# Patient Record
Sex: Male | Born: 1985 | Race: Black or African American | Hispanic: No | Marital: Married | Smoking: Former smoker
Health system: Southern US, Community
[De-identification: ages and names within clinical notes are randomized; demographics above are authoritative.]

## PROBLEM LIST (undated history)

## (undated) DIAGNOSIS — L600 Ingrowing nail: Secondary | ICD-10-CM

## (undated) DIAGNOSIS — M109 Gout, unspecified: Secondary | ICD-10-CM

---

## 2009-08-24 ENCOUNTER — Emergency Department (HOSPITAL_COMMUNITY): Admission: EM | Admit: 2009-08-24 | Discharge: 2009-08-24 | Payer: Self-pay | Admitting: Emergency Medicine

## 2010-02-13 ENCOUNTER — Emergency Department (HOSPITAL_COMMUNITY): Admission: EM | Admit: 2010-02-13 | Discharge: 2010-02-13 | Payer: Self-pay | Admitting: Emergency Medicine

## 2010-04-15 ENCOUNTER — Emergency Department (HOSPITAL_COMMUNITY): Admission: EM | Admit: 2010-04-15 | Discharge: 2010-04-16 | Payer: Self-pay | Admitting: Emergency Medicine

## 2010-09-16 ENCOUNTER — Emergency Department (HOSPITAL_COMMUNITY)
Admission: EM | Admit: 2010-09-16 | Discharge: 2010-09-17 | Payer: Self-pay | Source: Home / Self Care | Admitting: Emergency Medicine

## 2010-11-26 LAB — URINALYSIS, ROUTINE W REFLEX MICROSCOPIC
Ketones, ur: NEGATIVE mg/dL
Nitrite: NEGATIVE
Specific Gravity, Urine: 1.021 (ref 1.005–1.030)
Urobilinogen, UA: 0.2 mg/dL (ref 0.0–1.0)

## 2010-11-26 LAB — URINE MICROSCOPIC-ADD ON

## 2010-12-10 ENCOUNTER — Emergency Department (HOSPITAL_COMMUNITY)
Admission: EM | Admit: 2010-12-10 | Discharge: 2010-12-11 | Disposition: A | Discharge status: Home / Self Care | Payer: Self-pay | Attending: Emergency Medicine | Admitting: Emergency Medicine

## 2010-12-10 DIAGNOSIS — K089 Disorder of teeth and supporting structures, unspecified: Secondary | ICD-10-CM | POA: Insufficient documentation

## 2010-12-10 DIAGNOSIS — K047 Periapical abscess without sinus: Secondary | ICD-10-CM | POA: Insufficient documentation

## 2011-01-11 ENCOUNTER — Emergency Department (HOSPITAL_COMMUNITY)
Admission: EM | Admit: 2011-01-11 | Discharge: 2011-01-11 | Disposition: A | Discharge status: Home / Self Care | Payer: Self-pay | Attending: Emergency Medicine | Admitting: Emergency Medicine

## 2011-01-11 ENCOUNTER — Emergency Department (HOSPITAL_COMMUNITY): Payer: Self-pay

## 2011-01-11 DIAGNOSIS — M79609 Pain in unspecified limb: Secondary | ICD-10-CM | POA: Insufficient documentation

## 2011-06-09 ENCOUNTER — Emergency Department (HOSPITAL_COMMUNITY)
Admission: EM | Admit: 2011-06-09 | Discharge: 2011-06-09 | Disposition: A | Discharge status: Home / Self Care | Payer: Self-pay | Attending: Emergency Medicine | Admitting: Emergency Medicine

## 2011-06-09 DIAGNOSIS — K089 Disorder of teeth and supporting structures, unspecified: Secondary | ICD-10-CM | POA: Insufficient documentation

## 2011-06-09 DIAGNOSIS — K047 Periapical abscess without sinus: Secondary | ICD-10-CM | POA: Insufficient documentation

## 2011-10-13 ENCOUNTER — Emergency Department (HOSPITAL_COMMUNITY)
Admission: EM | Admit: 2011-10-13 | Discharge: 2011-10-13 | Disposition: A | Discharge status: Home / Self Care | Payer: Self-pay | Attending: Emergency Medicine | Admitting: Emergency Medicine

## 2011-10-13 ENCOUNTER — Encounter (HOSPITAL_COMMUNITY): Payer: Self-pay | Admitting: Emergency Medicine

## 2011-10-13 DIAGNOSIS — K029 Dental caries, unspecified: Secondary | ICD-10-CM | POA: Insufficient documentation

## 2011-10-13 DIAGNOSIS — K0889 Other specified disorders of teeth and supporting structures: Secondary | ICD-10-CM

## 2011-10-13 DIAGNOSIS — K089 Disorder of teeth and supporting structures, unspecified: Secondary | ICD-10-CM | POA: Insufficient documentation

## 2011-10-13 MED ORDER — HYDROCODONE-ACETAMINOPHEN 5-325 MG PO TABS: 1.0000 | ORAL_TABLET | ORAL | Status: AC | PRN

## 2011-10-13 MED ORDER — PENICILLIN V POTASSIUM 500 MG PO TABS: 500.0000 mg | ORAL_TABLET | Freq: Four times a day (QID) | ORAL | Status: AC

## 2011-10-13 NOTE — ED Provider Notes (Signed)
Medical screening examination/treatment/procedure(s) were performed by non-physician practitioner and as supervising physician I was immediately available for consultation/collaboration. Devoria Albe, MD, Armando Gang   Ward Givens, MD 10/13/11 [phone]

## 2011-10-13 NOTE — ED Provider Notes (Signed)
History     CSN: [id number]  Arrival date & time 10/13/11  1331   First MD Initiated Contact with Patient 10/13/11 1338      Chief Complaint  Patient presents with  . Dental Pain    pain in teeth in lower l/jaw, x 4 days    (Consider location/radiation/quality/duration/timing/severity/associated sxs/prior treatment) Patient is a 26 y.o. male presenting with tooth pain. The history is provided by the patient.  Dental PainThe primary symptoms include mouth pain. Primary symptoms do not include fever. The symptoms began 3 to 5 days ago. The symptoms are worsening. The symptoms are recurrent. The symptoms occur constantly.  Additional symptoms do not include: facial swelling, trouble swallowing and ear pain. Medical issues include: smoking.    History reviewed. No pertinent past medical history.  History reviewed. No pertinent past surgical history.  Family History  Problem Relation Age of Onset  . Diabetes Mother   . Hypertension Mother     History  Substance Use Topics  . Smoking status: Current Some Day Smoker  . Smokeless tobacco: Not on file  . Alcohol Use: Yes      Review of Systems  Constitutional: Negative for fever and chills.  HENT: Negative for ear pain, facial swelling and trouble swallowing.        Toothache.  Gastrointestinal: Negative.   Musculoskeletal: Negative for myalgias.  Neurological: Negative.     Allergies  Review of patient's allergies indicates no known allergies.  Home Medications   Current Outpatient Rx  Name Route Sig Dispense Refill  . VITAMIN C 1000 MG PO TABS Oral Take 1,000 mg by mouth daily.    . IBUPROFEN 200 MG PO TABS Oral Take 200 mg by mouth every 6 (six) hours as needed.    Marland Kitchen ONE-DAILY MULTI VITAMINS PO TABS Oral Take 2 tablets by mouth daily.      BP 126/87  Pulse 71  Temp(Src) 98.7 F (37.1 C) (Oral)  SpO2 100%  Physical Exam  Constitutional: He is oriented to person, place, and time. He appears well-developed  and well-nourished.  HENT:       Generally good dentition with left lower rear molar having severe decay. No visible abscess.   Neck: Normal range of motion.  Pulmonary/Chest: Effort normal.  Musculoskeletal: Normal range of motion.  Neurological: He is alert and oriented to person, place, and time.  Skin: Skin is warm and dry.  Psychiatric: He has a normal mood and affect.    ED Course  Procedures (including critical care time)  Labs Reviewed - No data to display No results found.   No diagnosis found.    MDM          Rodena Medin, PA-C 10/13/11 1453

## 2012-12-24 ENCOUNTER — Encounter (HOSPITAL_COMMUNITY): Payer: Self-pay | Admitting: Emergency Medicine

## 2012-12-24 ENCOUNTER — Emergency Department (HOSPITAL_COMMUNITY): Payer: BC Managed Care – PPO

## 2012-12-24 ENCOUNTER — Emergency Department (HOSPITAL_COMMUNITY)
Admission: EM | Admit: 2012-12-24 | Discharge: 2012-12-24 | Disposition: A | Discharge status: Home / Self Care | Payer: BC Managed Care – PPO | Attending: Emergency Medicine | Admitting: Emergency Medicine

## 2012-12-24 DIAGNOSIS — M25473 Effusion, unspecified ankle: Secondary | ICD-10-CM | POA: Insufficient documentation

## 2012-12-24 DIAGNOSIS — M25476 Effusion, unspecified foot: Secondary | ICD-10-CM | POA: Insufficient documentation

## 2012-12-24 DIAGNOSIS — F17200 Nicotine dependence, unspecified, uncomplicated: Secondary | ICD-10-CM | POA: Insufficient documentation

## 2012-12-24 DIAGNOSIS — M109 Gout, unspecified: Secondary | ICD-10-CM

## 2012-12-24 DIAGNOSIS — Z872 Personal history of diseases of the skin and subcutaneous tissue: Secondary | ICD-10-CM | POA: Insufficient documentation

## 2012-12-24 HISTORY — DX: Ingrowing nail: L60.0

## 2012-12-24 MED ORDER — OXYCODONE-ACETAMINOPHEN 5-325 MG PO TABS: 1.0000 | ORAL_TABLET | ORAL | Status: DC | PRN

## 2012-12-24 MED ORDER — COLCHICINE 0.6 MG PO TABS: 0.6000 mg | ORAL_TABLET | Freq: Two times a day (BID) | ORAL | Status: DC

## 2012-12-24 NOTE — ED Provider Notes (Signed)
History     CSN: [id number]  Arrival date & time 12/24/12  1247   First MD Initiated Contact with Patient 12/24/12 1401      Chief Complaint  Patient presents with  . Foot Pain    right    (Consider location/radiation/quality/duration/timing/severity/associated sxs/prior treatment) HPI Comments: Pt presents to the ED for right great toe pain and swelling x 2 days without recent trauma or injury.  States he wore dress shoes and walked around all weekend so thought he had just rubbed his toe the wrong way.  Earlier this am he was unable to get his shoe on due to pain and swelling.  No personal hx of gout but states father has it all the time.  Drinks beer and eats red meat on occasion.  No numbness or paresthesias of RLE.  Normal ROM and sensation of right great toe and foot.   The history is provided by the patient.    Past Medical History  Diagnosis Date  . Ingrown toenail     History reviewed. No pertinent past surgical history.  Family History  Problem Relation Age of Onset  . Diabetes Mother   . Hypertension Mother     History  Substance Use Topics  . Smoking status: Current Some Day Smoker  . Smokeless tobacco: Not on file  . Alcohol Use: Yes      Review of Systems  Musculoskeletal: Positive for joint swelling and arthralgias.  All other systems reviewed and are negative.    Allergies  Review of patient's allergies indicates no known allergies.  Home Medications   Current Outpatient Rx  Name  Route  Sig  Dispense  Refill  . ibuprofen (ADVIL,MOTRIN) 200 MG tablet   Oral   Take 200 mg by mouth every 6 (six) hours as needed for pain.            BP 135/94  Pulse 52  Temp(Src) 99 F (37.2 C) (Oral)  Resp 16  SpO2 100%  Physical Exam  Nursing note and vitals reviewed. Constitutional: He is oriented to person, place, and time. He appears well-developed and well-nourished.  HENT:  Head: Normocephalic and atraumatic.  Mouth/Throat: Oropharynx is  clear and moist.  Eyes: Conjunctivae and EOM are normal. Pupils are equal, round, and reactive to light.  Neck: Normal range of motion.  Cardiovascular: Normal rate, regular rhythm and normal heart sounds.   Pulmonary/Chest: Effort normal and breath sounds normal.  Musculoskeletal: Normal range of motion.       Right ankle: He exhibits swelling. He exhibits normal range of motion, no ecchymosis, no deformity, no laceration and normal pulse.       Feet:  Right great toe swelling and erythema, no laceration, deformity or signs of infection; normal sensation, strong distal pulse and cap refill  Neurological: He is alert and oriented to person, place, and time.  Skin: Skin is warm and dry.  Psychiatric: He has a normal mood and affect.    ED Course  Procedures (including critical care time)  Labs Reviewed - No data to display Dg Foot Complete Right  12/24/2012  *RADIOLOGY REPORT*  Clinical Data: Proximal great toe pain, question gout  RIGHT FOOT COMPLETE - 3+ VIEW  Comparison: Right foot films of 01/11/2011  Findings: Tarsal - metatarsal alignment is normal.  Joint spaces appear normal.  No erosion is seen.  No soft tissue calcification is noted.  IMPRESSION: Negative.   Original Report Authenticated By: Dwyane Dee, M.D.  1. Gout of big toe       MDM   Pt presenting to the ED for R great toe pain and swelling x 2 days.  No laceration, evidence of cellulitis or infection- symptoms consistent with gout.  Rx colchicine and percocet.  Begin taking ant-inflammatories at home.  Gout handout given.  Discussed plan with pt-he agreed.  Return precautions advised.    Garlon Hatchet, PA-C 12/24/12 1534

## 2012-12-24 NOTE — ED Notes (Signed)
Pt states was wearing dress shoes on Friday, R foot started hurting, then yesterday and today was worse and R inside of foot started swelling, swelling noted to R inner foot, tender to touch, no warmness noted.

## 2012-12-24 NOTE — ED Notes (Signed)
Patient c/o right foot pain and swelling. X 2 days.

## 2012-12-25 NOTE — ED Provider Notes (Signed)
Medical screening examination/treatment/procedure(s) were performed by non-physician practitioner and as supervising physician I was immediately available for consultation/collaboration.   Benny Lennert, MD 12/25/12 [phone]

## 2013-09-26 ENCOUNTER — Emergency Department (HOSPITAL_COMMUNITY): Payer: BC Managed Care – PPO

## 2013-09-26 ENCOUNTER — Encounter (HOSPITAL_COMMUNITY): Payer: Self-pay | Admitting: Emergency Medicine

## 2013-09-26 ENCOUNTER — Emergency Department (HOSPITAL_COMMUNITY)
Admission: EM | Admit: 2013-09-26 | Discharge: 2013-09-26 | Disposition: A | Discharge status: Home / Self Care | Payer: BC Managed Care – PPO | Attending: Emergency Medicine | Admitting: Emergency Medicine

## 2013-09-26 DIAGNOSIS — Z791 Long term (current) use of non-steroidal anti-inflammatories (NSAID): Secondary | ICD-10-CM | POA: Insufficient documentation

## 2013-09-26 DIAGNOSIS — M25519 Pain in unspecified shoulder: Secondary | ICD-10-CM | POA: Insufficient documentation

## 2013-09-26 DIAGNOSIS — S29011A Strain of muscle and tendon of front wall of thorax, initial encounter: Secondary | ICD-10-CM

## 2013-09-26 DIAGNOSIS — F17200 Nicotine dependence, unspecified, uncomplicated: Secondary | ICD-10-CM | POA: Insufficient documentation

## 2013-09-26 DIAGNOSIS — M436 Torticollis: Secondary | ICD-10-CM | POA: Insufficient documentation

## 2013-09-26 DIAGNOSIS — Z872 Personal history of diseases of the skin and subcutaneous tissue: Secondary | ICD-10-CM | POA: Insufficient documentation

## 2013-09-26 DIAGNOSIS — R071 Chest pain on breathing: Secondary | ICD-10-CM | POA: Insufficient documentation

## 2013-09-26 DIAGNOSIS — R0789 Other chest pain: Secondary | ICD-10-CM

## 2013-09-26 DIAGNOSIS — R5381 Other malaise: Secondary | ICD-10-CM | POA: Insufficient documentation

## 2013-09-26 DIAGNOSIS — R5383 Other fatigue: Secondary | ICD-10-CM

## 2013-09-26 MED ORDER — NAPROXEN 500 MG PO TABS: 500.0000 mg | ORAL_TABLET | Freq: Two times a day (BID) | ORAL | Status: DC

## 2013-09-26 MED ORDER — HYDROCODONE-ACETAMINOPHEN 5-325 MG PO TABS: 1.0000 | ORAL_TABLET | Freq: Four times a day (QID) | ORAL | Status: DC | PRN

## 2013-09-26 NOTE — ED Provider Notes (Signed)
CSN: [id number]     Arrival date & time 09/26/13  1120 History  This chart was scribed for non-physician practitioner Raymon MuttonMarissa Cedric Denison, PA-C, working with Shon Batonourtney F Horton, MD, by Yevette EdwardsAngela Bracken, ED Scribe. This patient was seen in room WTR8/WTR8 and the patient's care was started at 12:18 PM.  First MD Initiated Contact with Patient 09/26/13 1133     Chief Complaint  Patient presents with  . chest wall pain     The history is provided by the patient. No language interpreter was used.   HPI Comments: Illene Boluseddy Atkins is a 28 y.o. male who presents to the Emergency Department complaining of two days of pain to his left chest wall which began after a session of high repetition weight-lifting involving 90-100 pounds. The pt reports the pain radiates from his left chest to his left shoulder, and that his left arm is weak due to the pain. He also reports stiffness to his neck. The pt states raising his arm and palpation increase the pain to his chest and shoulder, and he characterizes the pain as "pulling."  The pt states he has experienced swelling to the left chest wall in addition to the pain. He has used IBU to treat the pain without resolution. He has not used Icy-hot or similar ointment. He denies fever, chills, neck pain, abdominal pain, nausea, emesis, diarrhea, SOB, dyspnea, and numbness or paresthesia of his arm. The pt is a current smoker.   He does not have a PCP.   Past Medical History  Diagnosis Date  . Ingrown toenail    History reviewed. No pertinent past surgical history. Family History  Problem Relation Age of Onset  . Diabetes Mother   . Hypertension Mother    History  Substance Use Topics  . Smoking status: Current Some Day Smoker  . Smokeless tobacco: Not on file  . Alcohol Use: Yes    Review of Systems  Constitutional: Negative for fever.  Respiratory: Negative for shortness of breath.   Cardiovascular: Positive for chest pain (Pt characterizes the chest pain as  muscular in nature. ).  Gastrointestinal: Negative for nausea, vomiting, abdominal pain and diarrhea.  Musculoskeletal: Positive for arthralgias, myalgias and neck stiffness. Negative for neck pain.  Neurological: Positive for weakness. Negative for numbness.  All other systems reviewed and are negative.   Allergies  Review of patient's allergies indicates no known allergies.  Home Medications   Current Outpatient Rx  Name  Route  Sig  Dispense  Refill  . ibuprofen (ADVIL,MOTRIN) 200 MG tablet   Oral   Take 200 mg by mouth every 6 (six) hours as needed for pain.          . naproxen (NAPROSYN) 500 MG tablet   Oral   Take 1 tablet (500 mg total) by mouth 2 (two) times daily.   30 tablet   0    Triage Vitals: BP 146/85  Pulse 63  Temp(Src) 98.8 F (37.1 C) (Oral)  Resp 20  SpO2 98%  Physical Exam  Nursing note and vitals reviewed. Constitutional: He is oriented to person, place, and time. He appears well-developed and well-nourished. No distress.  HENT:  Head: Normocephalic and atraumatic.  Eyes: EOM are normal.  Neck: Normal range of motion. Neck supple. No tracheal deviation present.  Negative neck stiffness Negative nuchal rigidity Negative cervical lymphadenopathy  Cardiovascular: Normal rate, regular rhythm and normal heart sounds.   Pulses:      Radial pulses are 2+ on the right  side, and 2+ on the left side.  Pulmonary/Chest: Effort normal and breath sounds normal. No respiratory distress. He has no wheezes. He has no rales. He exhibits tenderness.    Left pectoralis major muscle identified to be swollen when compared to the right side. Discomfort upon palpation to the pectoralis major muscle. Negative erythema, inflammation, lesions, sores, ecchymosis noted.  Musculoskeletal: Normal range of motion. He exhibits tenderness.       Arms: Mild swelling localized to the left shoulder. Discomfort upon palpation to the anterior and posterior aspect of the left  shoulder. Positive apprehension test identified. Negative drop arm. Negative tenting of the clavicles bilaterally. Discomfort upon palpation to the left biceps and triceps. Full range of motion to the left shoulder identified with discomfort-discomfort noted particularly with inversion and eversion.  Lymphadenopathy:    He has no cervical adenopathy.  Neurological: He is alert and oriented to person, place, and time.  Cranial nerves III-XII grossly intact Strength 5+/5+ to upper and lower extremities bilaterally with resistance applied, equal distribution noted Strength intact to MCP, PIP, DIP joints of left hand Sensation intact to upper extremities bilaterally with differentiation to sharp and dull touch   Skin: Skin is warm and dry.  Psychiatric: He has a normal mood and affect. His behavior is normal.    ED Course  Procedures (including critical care time)  DIAGNOSTIC STUDIES: Oxygen Saturation is 98% on room air, normal by my interpretation.    COORDINATION OF CARE:  12:25 PM- Discussed treatment plan with patient, which includes imaging, and the patient agreed to the plan.   12:37 PM This provider spoke with Dr. Cristy Folks who recommended chest xray to be performed and for patient to be placed in sling. Reported that this is not an emergent MRI scenario. Recommended patient to be followed up with orthopedics.    Dg Chest 2 View  09/26/2013   CLINICAL DATA:  Chest pain  EXAM: CHEST  2 VIEW  COMPARISON:  April 16, 2010  FINDINGS: Lungs are clear. Heart size and pulmonary vascularity are normal. No adenopathy. No pneumothorax. No bone lesions.  IMPRESSION: No abnormality noted.   Electronically Signed   By: Bretta Bang M.D.   On: 09/26/2013 13:19   Dg Shoulder Left  09/26/2013   CLINICAL DATA:  Left shoulder pain.  EXAM: LEFT SHOULDER - 2+ VIEW  COMPARISON:  None.  FINDINGS: There is no evidence of fracture or dislocation. Visualized ribs appear normal. There is no evidence of  arthropathy or other focal bone abnormality. Soft tissues are unremarkable.  IMPRESSION: Normal left shoulder.   Electronically Signed   By: Roque Lias M.D.   On: 09/26/2013 13:19   Labs Review Labs Reviewed - No data to display Imaging Review Dg Chest 2 View  09/26/2013   CLINICAL DATA:  Chest pain  EXAM: CHEST  2 VIEW  COMPARISON:  April 16, 2010  FINDINGS: Lungs are clear. Heart size and pulmonary vascularity are normal. No adenopathy. No pneumothorax. No bone lesions.  IMPRESSION: No abnormality noted.   Electronically Signed   By: Bretta Bang M.D.   On: 09/26/2013 13:19   Dg Shoulder Left  09/26/2013   CLINICAL DATA:  Left shoulder pain.  EXAM: LEFT SHOULDER - 2+ VIEW  COMPARISON:  None.  FINDINGS: There is no evidence of fracture or dislocation. Visualized ribs appear normal. There is no evidence of arthropathy or other focal bone abnormality. Soft tissues are unremarkable.  IMPRESSION: Normal left shoulder.  Electronically Signed   By: Roque Lias M.D.   On: 09/26/2013 13:19    EKG Interpretation   None       MDM   1. Chest wall pain   2. Rupture of pectoralis major muscle     Filed Vitals:   09/26/13 1127  BP: 146/85  Pulse: 63  Temp: 98.8 F (37.1 C)  TempSrc: Oral  Resp: 20  SpO2: 98%   I personally performed the services described in this documentation, which was scribed in my presence. The recorded information has been reviewed and is accurate.  Patient presenting to emergency department with left-sided chest discomfort-muscular-and left shoulder pain does been ongoing for the past 2 days. Patient reports that he was lifting weights, approximately 4 sets of 20 reps. Patient reported that he was working out his triceps and biceps on Monday and has worked at her shoulders on Tuesday. Patient reports he's noticed swelling to the left side of his chest approximately 2 days ago. Reports he's been using ibuprofen as needed with minimal relief. Alert oriented. GCS  15. Heart rate and rhythm normal. Lungs clear to auscultation. Swelling identified to the pectoris major muscle of the left side and left shoulder. Discomfort upon palpation to the left pectoralis major muscle and anterior, posterior aspect of the left shoulder. Full range of motion to left shoulder identified with most discomfort upon inversion and eversion of the left shoulder. Discomfort upon palpation to triceps and biceps the left arm circumferentially. Negative deformities identified-negative sunken in appearance identified left shoulder. Negative tenting of the clavicles bilaterally. Pulses palpable and strong, radial 2+ bilaterally. Sensation intact. Strength intact with equal resistance identified. Strength intact to MCP, PIP, DIP joints of the left hand. Discussed case, presentation and physical exam with attending physician, Dr. Wilkie Aye recommended chest x-ray to be performed. Attending physician reported that this is not an emergent MRI. Left shoulder x-ray negative findings. Chest x-ray negative findings. Doubt fracture. Suspicion to be possible muscular injury - suspicion to be pectoralis major rupture. Patient placed in sling for comfort. Patient stable, afebrile. Discharge patient. Discussed with patient to avoid any physical or strenuous activity-recommended patient not to perform any lifting. Discharge patient with anti-inflammatories and small dose of pain medications as discussed course, cautions, disposal technique. Discussed with patient to rest and stay hydrated. Discussed with patient absolutely no heavy weight lifting/lifting in general. Referred patient to orthopedics. Discussed with patient to closely monitor symptoms and if symptoms are to worsen or change to report back to the ED - strict return instructions given.  Patient agreed to plan of care, understood, all questions answered.    Raymon Mutton, PA-C 09/27/13 [phone]

## 2013-09-26 NOTE — Discharge Instructions (Signed)
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 It is responsible for bringing the arm across the body and for rotating the arm inward. A pectoralis major rupture is a tear in the tendon that attaches the chest muscle to the upper arm bone (humerus). When the tendon is torn, there is a loss of connection between the muscle and the bone. Pectoralis major ruptures usually involve the tendon pulling off from the arm bone, although sometimes the muscle may tear in the mid-belly or at the point where the muscle becomes tendon. SYMPTOMS   A "pop" or tearing, and severe sharp, often burning, pain in the chest at the time of injury.  Tenderness, swelling, warmth, or redness and later bruising (contusion) over and around the pectoralis muscle-tendon, chest, and armpit region.  Pain and weakness when trying to use the chest muscle.  Loss of shape of the armpit region, especially when pushing your hands together in front of your body.  Loss of firm fullness, when pushing on the area where the tendon ruptured (a defect between the ends of the tendon and bone where they separated from each other). CAUSES  The pectoralis major muscle or tendon tears when a force is placed on it that is greater than it can handle. Common causes of injury include:  Sudden episode of stressful over-activity.  Direct hit (trauma) to the chest.  Fall from a height. RISK INCREASES WITH:  Sports that require excessive muscle stress, (weightlifting).  Contact sports with minimal protective devices for the chest.  Wrestling.  Poor strength and flexibility.  Previous injury to the chest muscle.  Untreated pectoralis major tendinitis.  Corticosteroid injection into the pectoralis major tendon. (Corticosteroid injections damage tendons.)  Oral  anabolic steroid use. PREVENTION  Warm up and stretch properly before activity.  Allow for adequate recovery between workouts.  Maintain physical fitness:  Strength, flexibility, and endurance.  Cardiovascular fitness. PROGNOSIS  If treated properly, pectoralis major ruptures are usually curable, with a return to sports in 6 to 9 months.  RELATED COMPLICATIONS   Weakness of the pectoralis major, especially if left untreated.  Re-rupture of the tendon after treatment.  Prolonged disability.  Risks of surgery: infection, injury to nerves (numbness, weakness, or paralysis), bleeding, hematoma (blood clot), pseudocyst (collection of fluid), shoulder stiffness, shoulder weakness, and pain with strenuous activity.  Loss of chest or armpit shape.  Inability to repair rupture. TREATMENT Treatment first involves resting from any activities that aggravate the symptoms. The use of ice and medicine will help reduce pain and inflammation. The use of strengthening and stretching exercises may help reduce pain with activity. These exercises may be performed at home. However, referral to a therapist may be advised for further evaluation and treatment, such as ultrasound therapy. If the rupture occurs in the muscle or the muscle-tendon juncture, surgery repair is not possible. However, for tears that occur at the attachment site of the arm bone, surgery may be advised. Without surgery, the loss of normal armpit shape and weakness of the shoulder will persist. For the best chance of a successful outcome, surgery must be performed within the first few weeks after injury. After surgery, the chest and shoulder of the affected side are restrained, to allow for healing. After restraint, it is important to perform strengthening and stretching exercises to help regain strength  and a full range of motion.  MEDICATION   If pain medicine is needed, nonsteroidal anti-inflammatory medicines (aspirin and ibuprofen),  or other minor pain relievers (acetaminophen), are often advised.  Do not take pain medicine for 7 days before surgery.  Prescription pain relievers may be given, if your caregiver thinks they are needed. Use only as directed and only as much as you need. COLD THERAPY  Cold treatment (icing) should be applied for 10 to 15 minutes every 2 to 3 hours for inflammation and pain, and immediately after activity that aggravates your symptoms. Use ice packs or an ice massage. SEEK MEDICAL CARE IF:  Pain increases, despite treatment.  Any of the following occur after surgery: signs of infection, including fever, increased pain, swelling, redness, drainage of fluids, or bleeding in the affected area.  New, unexplained symptoms develop. (Drugs used in treatment may produce side effects.) EXERCISES RANGE OF MOTION (ROM) AND STRETCHING EXERCISES - Pectoralis Major Rupture These exercises may help you when beginning to rehabilitate your injury. Your symptoms may resolve with or without further involvement from your physician, physical therapist or athletic trainer. While completing these exercises, remember:   Restoring tissue flexibility helps normal motion to return to the joints. This allows healthier, less painful movement and activity.  An effective stretch should be held for at least 30 seconds.  A stretch should never be painful. You should only feel a gentle lengthening or release in the stretched tissue. ROM - Pendulum  Bend at the waist so that your right / left arm falls away from your body. Support yourself with your opposite hand on a solid surface, such as a table or a countertop.  Your right / left arm should be perpendicular to the ground. If it is not perpendicular, you need to lean over farther. Relax the muscles in your right / left arm and shoulder as much as possible.  Gently sway your hips and trunk so they move your right / left arm without any use of your right / left shoulder  muscles.  Progress your movements so that your right / left arm moves side to side, then forward and backward, and finally, both clockwise and counterclockwise.  Complete __________ repetitions in each direction. Many people use this exercise to relieve discomfort in their shoulder, as well as to gain range of motion. Repeat __________ times. Complete this exercise __________ times per day. STRETCH  Flexion, Standing  Stand with good posture. With an underhand grip on your right / left and an overhand grip on the opposite hand, grasp a broomstick or cane so that your hands are a little more than shoulder width apart.  Keeping your right / left elbow straight and shoulder muscles relaxed, push the stick with your opposite hand to raise your right / left arm in front of your body and then overhead. Raise your arm until you feel a stretch in your right / left shoulder, but before you have increased shoulder pain.  Try to avoid shrugging your right / left shoulder as your arm rises, by keeping your shoulder blade tucked down and toward your mid-back spine. Hold __________ seconds.  Slowly return to the starting position. Repeat __________ times. Complete this exercise __________ times per day.  STRETCH  Abduction, Supine  Lie on your back. With an underhand grip on your right / left hand and an overhand grip on the opposite hand, grasp a broomstick or cane so that your hands are a little more than shoulder  width apart.  Keeping your right / left elbow straight and shoulder muscles relaxed, push the stick with your opposite hand to raise your right / left arm out to the side of your body and then overhead. Raise your arm until you feel a stretch in your right / left shoulder, but before you have increased shoulder pain.  Try to avoid shrugging your right / left shoulder as your arm rises, by keeping your shoulder blade tucked down and toward your mid-back spine. Hold __________ seconds.  Slowly  return to the starting position. Repeat __________ times. Complete this exercise __________ times per day.  ROM  Flexion, Active-Assisted  Lie on your back. You may bend your knees for comfort.  Grasp a broomstick or cane so your hands are about shoulder width apart. Your right / left hand should grip the end of the stick, so that your hand is positioned "thumbs-up," as if you were about to shake hands.  Using your healthy arm to lead, raise your right / left arm overhead until you feel a gentle stretch in your shoulder. Hold __________ seconds.  Use the stick to assist in returning your right / left arm to its starting position. Repeat __________ times. Complete this exercise __________ times per day.  STRETCH - External Rotation and Abductio  Stagger your stance through a doorframe. It does not matter which foot is forward.  Choose one of the following positions as instructed by your physician, physical therapist or athletic trainer. Place your hands:  and forearms above your head and on the door frame.  and forearms at head height and on the door frame.  at elbow height and on the door frame.  Keeping your head and chest upright and your stomach muscles tight to prevent over-extending your low-back, slowly shift your weight onto your front foot until you feel a stretch across your chest and in the front of your shoulders.  Hold __________ seconds. Shift your weight to your back foot to release the stretch. Repeat __________ times. Complete this stretch __________ times per day.  STRENGTHENING EXERCISES - Pectoralis Major Rupture  These exercises may help you when beginning to rehabilitate your injury. They may resolve your symptoms with or without further involvement from your physician, physical therapist or athletic trainer. While completing these exercises, remember:   Muscles can gain both the endurance and the strength needed for everyday activities through controlled  exercises.  Complete these exercises as instructed by your physician, physical therapist or athletic trainer. Increase the resistance and repetitions only as guided by your caregiver.  You may experience muscle soreness or fatigue, but the pain or discomfort you are trying to eliminate should never worsen during these exercises. If this pain does worsen, stop and make certain you are following the directions exactly. If the pain is still present after adjustments, discontinue the exercise until you can discuss the trouble with your caregiver. STRENGTH - Scapular Protractors, Standing  Stand arms length away from a wall. Place your hands on the wall, keeping your elbows straight.  Begin by dropping your shoulder blades down and toward your mid-back spine.  To strengthen your protractors, keep your shoulder blades down, but slide them forward on your rib cage. It will feel as if you are lifting the back of your rib cage away from the wall. This is a subtle motion and can be challenging to complete. Ask your caregiver for further instruction, if you are not sure you are doing the exercise correctly.  Hold for __________ seconds. Slowly return to the starting position, resting the muscles completely before starting the next repetition. Repeat __________ times. Complete this exercise __________ times per day. STRENGTH - Horizontal Abductors Choose one of the two positions to complete this exercise. Prone: lying on stomach:  Lie on your stomach on a firm surface so that your right / left arm overhangs the edge. Rest your forehead on your opposite forearm. With your palm facing the floor and your elbow straight, hold a __________ weight in your hand.  Squeeze your right / left shoulder blade to your mid-back spine and then slowly raise your arm to the height of the bed.  Hold for __________ seconds. Slowly reverse the directions and return to the starting position, controlling the weight as you lower  your arm. Repeat __________ times. Complete this exercise __________ times per day. Standing:   Secure a rubber exercise band or tubing to a fixed object (table, pole) so that it is at the height of your shoulders when you are either standing, or sitting on a firm armless chair.  Grasp an end of the band in each hand and have your palms face each other. Straighten your elbows and lift your hands straight in front of you at shoulder height. Step back away from the secured end of band until it becomes tense.  Squeeze your shoulder blades together. Keeping your elbows locked and your hands at shoulder height, bring your hands out to your sides.  Hold __________ seconds. Slowly ease the tension on the band, as you reverse the directions and return to the starting position. Repeat __________ times. Complete this exercise __________ times per day. STRENGTH - Scapular Protractors, Quadruped  Get onto your hands and knees with your shoulders directly over your hands (or as close as you can comfortably be).  Keeping your elbows locked, lift the back of your rib cage up into your shoulder blades so your mid-back rounds out. Keep your neck muscles relaxed.  Hold this position for __________ seconds. Slowly return to the starting position and allow your muscles to relax completely before starting the next repetition. Repeat __________ times. Complete this exercise __________ times per day.  STRENGTH  Scapular Depressors  Find a sturdy chair without wheels, such as a dining table chair, and place your hands on the armrests.  Keeping your feet on the floor, lift your bottom from the seat and lock your elbows.  Keeping your elbows straight, allow gravity to pull your body weight down. Your shoulders will rise toward your ears.  Raise your body against gravity by drawing your shoulder blades down your back, shortening the distance between your shoulders and ears. Although your feet should always maintain  contact with the floor, your feet should progressively support less body weight as you get stronger.  Hold __________ seconds. In a controlled and slow manner, lower your body weight to begin the next repetition. Repeat __________ times. Complete this exercise __________ times per day.  STRENGTH - Scapular Protractors, Supine  Lie on your back on a firm surface. Extend your right / left arm straight into the air while holding a __________ weight in your hand.  Keeping your head and back in place, lift your shoulder off the floor.  Hold __________ seconds. Slowly return to the starting position and allow your muscles to relax completely before starting the next repetition. Repeat __________ times. Complete this exercise __________ times per day. Document Released: 08/29/2005 Document Revised: 11/21/2011 Document Reviewed: 12/11/2008 ExitCare  Patient Information 2014 ExitCare, [place]. medications as prescribed Please no physical activity or lifting until seen by orthopedics. Please keep arm in sling at all times Please massage with icy hot ointment Please continue monitor symptoms closely and if symptoms are to worsen or change (fever, swelling, redness, bruising, worsening pain, numbness, tingling, fall, injury, chest pain, shortness of breath, difficulty breathing) please report back to emergency department immediately  Chest Wall Pain Chest wall pain is pain in or around the bones and muscles of your chest. It may take up to 6 weeks to get better. It may take longer if you must stay physically active in your work and activities.  CAUSES  Chest wall pain may happen on its own. However, it may be caused by:  A viral illness like the flu.  Injury.  Coughing.  Exercise.  Arthritis.  Fibromyalgia.  Shingles. HOME CARE INSTRUCTIONS   Avoid overtiring physical activity. Try not to strain or perform activities that cause pain. This includes any activities using your chest or your  abdominal and side muscles, especially if heavy weights are used.  Put ice on the sore area.  Put ice in a plastic bag.  Place a towel between your skin and the bag.  Leave the ice on for 15-20 minutes per hour while awake for the first 2 days.  Only take over-the-counter or prescription medicines for pain, discomfort, or fever as directed by your caregiver. SEEK IMMEDIATE MEDICAL CARE IF:   Your pain increases, or you are very uncomfortable.  You have a fever.  Your chest pain becomes worse.  You have new, unexplained symptoms.  You have nausea or vomiting.  You feel sweaty or lightheaded.  You have a cough with phlegm (sputum), or you cough up blood. MAKE SURE YOU:   Understand these instructions.  Will watch your condition.  Will get help right away if you are not doing well or get worse. Document Released: 08/29/2005 Document Revised: 11/21/2011 Document Reviewed: 04/25/2011 [facility] Patient Information 2014 [place], [place].   Emergency Department Resource Guide 1) Find a Doctor and Pay Out of Pocket Although you won't have to find out who is covered by your insurance plan, it is a good idea to ask around and get recommendations. You will then need to call the office and see if the doctor you have chosen will accept you as a new patient and what types of options they offer for patients who are self-pay. Some doctors offer discounts or will set up payment plans for their patients who do not have insurance, but you will need to ask so you aren't surprised when you get to your appointment.  2) Contact Your Local Health Department Not all health departments have doctors that can see patients for sick visits, but many do, so it is worth a call to see if yours does. If you don't know where your local health department is, you can check in your phone book. The CDC also has a tool to help you locate your state's health department, and many state websites also have listings of  all of their local health departments.  3) Find a Walk-in Clinic If your illness is not likely to be very severe or complicated, you may want to try a walk in clinic. These are popping up all over the country in pharmacies, drugstores, and shopping centers. They're usually staffed by nurse practitioners or physician assistants that have been trained to treat common illnesses and complaints. They're usually fairly quick and  inexpensive. However, if you have serious medical issues or chronic medical problems, these are probably not your best option.  No Primary Care Doctor: - Call Health Connect at  [phone] - they can help you locate a primary care doctor that  accepts your insurance, provides certain services, etc. - Physician Referral Service- [phone]  Chronic Pain Problems: Organization         Address  Phone   Notes  [facility]  [phone] Patients need to be referred by their primary care doctor.   Medication Assistance: Organization         Address  Phone   Notes  [facility] [address] [place] [place], [place] [address] [phone] --Must be a resident of [facility] -- Must have NO insurance coverage whatsoever (no Medicaid/ Medicare, etc.) -- The pt. MUST have a primary care doctor that directs their care regularly and follows them in the community   [facility]  [phone]   [employer]  [phone]    Agencies that provide inexpensive medical care: Organization         Address  Phone   Notes  [facility]  [phone]   [facility]    [phone]   [facility] [address] [place], [place] [address] [phone]   [facility] [address], [place] [phone]   [facility]anned Parenthood    [phone]   [facility]    [phone]   [facility]  [address],  [place] Phone:  [phone], Fax:  [phone] Hours of Operation:  9 am - 6 pm, M-F.  Also accepts Medicaid/Medicare and self-pay.  [facility]  [address], [place] Phone: [phone], Fax: [phone]. Hours of Operation:  8:30 am - 5:30 pm, M-F.  Also accepts Medicaid and self-pay.  [facility] [place] [address] [place] Phone: [phone]   [facility] [address] Natasha Bence [place], [place] [phone], Ext. [phone] Mondays & Thursdays: 7-9 AM.  First 15 patients are seen on a first come, first serve basis.    Medicaid-accepting [facility] Providers:  Organization         Address  Phone   Notes  [facility] [address],  [phone] Also accepts self-pay patients.  [facility] [address] Laurell Josephs [place], [place]  [phone]   [facility] [address], [place] [phone]   [facility] [address], [place] [phone]   Renaye Rakers [address], [place]   [phone] Only accepts [place] patients after they have their name applied to their card.   Self-Pay (no insurance) in [facility]:  Organization         Address  Phone   Notes  Sickle Cell Patients, [facility] [address] [place], [place] [phone]   [facility] [address] [place], [place] [phone]   [facility] [place]  [address], [place] [phone]   [facility]/Dr. Osei-Bonsu  [address] [place] or [address], [place] [phone] Phone number for both [place] and [place] locations is the same.  [facility] and [facility] [address] [place] [phone]   [facility] [address], [place] or [address] [phone]   [facility] [address], [place] [phone], phone; [phone], fax Sees patients 1st and 3rd Saturday of every month.  Must not qualify for public or private insurance (i.e. Medicaid, Medicare, [place] Health Choice, Veterans' Benefits)  Household income should be no more than 200% of the poverty level The clinic cannot treat you if you are pregnant or think you are pregnant  Sexually transmitted diseases are not treated at the clinic.    Dental Care: Organization         Address  Phone  Notes  [facility] [place] [facility] Department of [facility] [address] [place], [place] [phone] Accepts children up to age 63 who are enrolled in [place] or [place] Health Choice; pregnant women with a Medicaid card; and children who have applied for Medicaid or [place] Health Choice, but were declined, whose parents can pay a reduced fee at time of service.  [facility] Department of [facility]  [address], [place] [phone] Accepts children up to age 66 who are enrolled in [place] or [place] Health Choice; pregnant women with a Medicaid card; and children who have applied for Medicaid or [place] Health Choice, but were declined, whose parents can pay a reduced fee at time of service.  [facility]  [address] [place], [place] [phone] Patients are seen by appointment only. Walk-ins are not accepted. [facility] will see patients 65 years of age and older. Monday - Tuesday (8am-5pm) Most Wednesdays (8:30-5pm) $30 per visit, cash only  [facility]  [address], [facility] [phone] Patients are seen by appointment only. Walk-ins are not accepted. [facility] will see patients 78 years of age and older. One Wednesday Evening (Monthly: Volunteer Based).  $30 per visit, cash only  [employer]   [phone] for adults; Children under age 18, call Graduate Pediatric Dentistry at [phone]. Children aged 74-14, please call [phone] to request a pediatric application.  Dental services are provided in all areas of dental care including fillings, crowns and bridges, complete and partial dentures, implants, gum treatment, root canals, and extractions. Preventive care is also provided. Treatment is provided to both adults and children. Patients are selected via a lottery and there is often a waiting list.   [facility] [address] [place]  [phone] [email or web address]   [facility] [address] [place], [place] [phone], Ext. [phone] Second and Fourth Thursday of each month, opens at 6:30 AM; Clinic ends at 9 AM.  Patients are seen on a first-come first-served basis, and a limited number are seen during each clinic.   [facility]  [address] [place], [place] [phone]   Eligibility Requirements You must have lived in [place], [place], or [place] counties for at least the last three months.   You cannot be eligible for state or federal sponsored [place], including [employer], [place], or [employer].   You generally cannot be eligible for healthcare insurance through your employer.    How to apply: Eligibility screenings are held every Tuesday and Wednesday afternoon from 1:00 pm until 4:00 pm. You  do not need an appointment for the interview!  [facility] [address], [place], [place] [phone]   [facility]  [phone]   [facility]  [phone]   [facility]  [phone]    Behavioral Health Resources in the Community: Intensive Outpatient Programs Organization         Address  Phone  Notes  [facility] [address], [place], [place] [phone]   [facility] [address], [place], [place] [phone]   [facility] [address] [place], [place]  [phone]   [facility] [address],  [place], [place] [phone] or [phone]   Substance Abuse Resources Organization         Address  Phone  Notes  [facility]  [phone]   [facility]  [phone]   [facility]  [phone]   Floydene Flock  [phone]   Residential & Outpatient Substance Abuse Program  [phone]   Psychological Services Organization         Address  Phone  Notes  [facility]  [phone]   [facility]  [phone]   [facility] Mental Health [address], [place] [phone] or [phone]    Mobile Crisis Teams Organization         Address  Phone  Notes  [facility], Mobile Crisis Care Unit  [phone]   [facility]  [address] [place], [place] [phone]   Doristine Locks [address] [place] [phone]    Self-Help/Support Groups Organization         Address  Phone             Notes  [facility] - variety of support groups  [phone]- I7437963 Call for more information  Narcotics Anonymous (NA), [facility] [address], [employer] [place]  2 meetings at this location   Statistician         Address  Phone  Notes  [facility] [address],    [place]  [phone]   [facility]  [address] [place] [address], [place], [place] [phone]   [facility] [address] [place], [place] [phone] Admissions: 8am-3pm M-F  [facility] [address]    [place], [place] [phone]   [facility] [address] [place], [place], [place] [phone]   [facility] [address],  [place], [place] [phone]     [facility] - Intensive Outpatient [address], [place], [place] [phone]   [facility] ([facility] [address] [place]  [place], [place] [phone] or [phone]   [facility] ([facility]) [address] [place], [place] [phone] Accepts Medicaid  [facility] [address],  [place] [phone] Substance Abuse/Addiction Treatment   [facility] Organization         Address  Phone  Notes  [facility]  [phone]   Angie Fava, PhD [address] [place] [place], [place]   [phone] or [phone]   [facility]   [address] [place], [place] [phone]   [facility] [address] [place], [place] [phone] Insurance/Medicaid/sponsorship through [employer] and Families [address]  [place], [place] [phone] [place], [place] [phone]    Dr. Adele Schilder  [phone]   [facility] Dept. 1) [address] [place] 2) [place] 3)  [address], [place] [phone]  [phone]   [place] [phone] or [phone] (After Hours)

## 2013-09-26 NOTE — ED Notes (Signed)
Pt working out a few days and pulled a muscle in his upper left chest; states has some swelling with pain with movement and touch

## 2013-09-28 NOTE — ED Provider Notes (Signed)
Medical screening examination/treatment/procedure(s) were performed by non-physician practitioner and as supervising physician I was immediately available for consultation/collaboration.  EKG Interpretation   None        Shon Batonourtney F Horton, MD 09/28/13 [phone]

## 2013-11-20 ENCOUNTER — Encounter (HOSPITAL_COMMUNITY): Payer: Self-pay | Admitting: Emergency Medicine

## 2013-11-20 ENCOUNTER — Emergency Department (HOSPITAL_COMMUNITY)
Admission: EM | Admit: 2013-11-20 | Discharge: 2013-11-20 | Disposition: A | Discharge status: Home / Self Care | Payer: BC Managed Care – PPO | Source: Home / Self Care | Attending: Family Medicine | Admitting: Family Medicine

## 2013-11-20 DIAGNOSIS — J039 Acute tonsillitis, unspecified: Secondary | ICD-10-CM

## 2013-11-20 LAB — POCT RAPID STREP A: Streptococcus, Group A Screen (Direct): NEGATIVE

## 2013-11-20 MED ORDER — CLINDAMYCIN HCL 300 MG PO CAPS: 300.0000 mg | ORAL_CAPSULE | Freq: Four times a day (QID) | ORAL | Status: DC

## 2013-11-20 NOTE — Discharge Instructions (Signed)
Your strep test was negative, however, you are being treated for atypical infection that can cause tonsillitis. In addition to taking medication as directed, use warm salt water gargles 4 x day and tylenol or ibuprofen as directed on packing for discomfort. If you develop any difficulty breathing, speaking or swallowing, seek immediate medical attention.

## 2013-11-20 NOTE — ED Provider Notes (Signed)
CSN: [id number]     Arrival date & time 11/20/13  1758 History   First MD Initiated Contact with Patient 11/20/13 1903     Chief Complaint  Patient presents with  . Sore Throat   (Consider location/radiation/quality/duration/timing/severity/associated sxs/prior Treatment) HPI Comments: Reports associated chills. No additional URI sx. No difficulty breathing, speaking or handling his own secretions.   Patient is a 28 y.o. male presenting with pharyngitis. The history is provided by the patient.  Sore Throat This is a new problem. Episode onset: 4 days ago. The problem occurs constantly. The problem has been gradually worsening. The symptoms are aggravated by swallowing, eating and drinking.    Past Medical History  Diagnosis Date  . Ingrown toenail    History reviewed. No pertinent past surgical history. Family History  Problem Relation Age of Onset  . Cancer Father     Bone cancer  . Diabetes Other   . Hypertension Other    History  Substance Use Topics  . Smoking status: Former Smoker    Types: Cigars    Quit date: 07/23/2013  . Smokeless tobacco: Not on file  . Alcohol Use: Yes     Comment: occasional    Review of Systems  All other systems reviewed and are negative.    Allergies  Review of patient's allergies indicates no known allergies.  Home Medications   Current Outpatient Rx  Name  Route  Sig  Dispense  Refill  . ibuprofen (ADVIL,MOTRIN) 200 MG tablet   Oral   Take 800 mg by mouth every 6 (six) hours as needed for moderate pain.          . clindamycin (CLEOCIN) 300 MG capsule   Oral   Take 1 capsule (300 mg total) by mouth 4 (four) times daily. X 10 days   40 capsule   0   . HYDROcodone-acetaminophen (NORCO/VICODIN) 5-325 MG per tablet   Oral   Take 1 tablet by mouth every 6 (six) hours as needed.   5 tablet   0   . naproxen (NAPROSYN) 500 MG tablet   Oral   Take 1 tablet (500 mg total) by mouth 2 (two) times daily.   30 tablet   0     BP 128/82  Pulse 58  Temp(Src) 98.7 F (37.1 C) (Oral)  Resp 18  SpO2 100% Physical Exam  Nursing note and vitals reviewed. Constitutional: He is oriented to person, place, and time. He appears well-developed and well-nourished. No distress.  HENT:  Head: Normocephalic and atraumatic.  Right Ear: Hearing, tympanic membrane, external ear and ear canal normal.  Left Ear: Hearing, tympanic membrane, external ear and ear canal normal.  Nose: Nose normal.  Mouth/Throat: Uvula is midline and mucous membranes are normal. No trismus in the jaw. No dental abscesses or dental caries. Oropharyngeal exudate, posterior oropharyngeal edema and posterior oropharyngeal erythema present. No tonsillar abscesses.  Eyes: Conjunctivae are normal. Right eye exhibits no discharge. Left eye exhibits no discharge. No scleral icterus.  Neck: Normal range of motion. Neck supple. No thyromegaly present.  Cardiovascular: Normal rate, regular rhythm and normal heart sounds.   Pulmonary/Chest: Effort normal and breath sounds normal. No stridor.  Musculoskeletal: Normal range of motion.  Lymphadenopathy:    He has no cervical adenopathy.  Neurological: He is alert and oriented to person, place, and time.  Skin: Skin is warm and dry.  Psychiatric: He has a normal mood and affect. His behavior is normal.    ED Course  Procedures (including critical care time) Labs Review Labs Reviewed  POCT RAPID STREP A ([facility] URG CARE ONLY)   Imaging Review No results found.   MDM   1. Tonsillitis with exudate    Tonsillitis: Rapid strep negative. Will cover for atypical infection with clindamycin while results of throat culture are pending. Will advise warm salt water gargles TID-QID, tylenol or ibuprofen for pain. Cautioned patient that if symptoms do not begin to improve over next 48 hours or he develops any difficulty breathing, speaking or swallowing, he should seek immediate medical attention.    Jess Barters  [place], [place] 11/20/13 1945

## 2013-11-20 NOTE — ED Notes (Signed)
C/o tonsils swelling onset Sunday night and sore throat on Mon.  States he woke up with sweating and cold chills.  States his lymph nodes are swollen and neck is a little stiff.

## 2013-11-20 NOTE — ED Provider Notes (Signed)
Medical screening examination/treatment/procedure(s) were performed by a resident physician or non-physician practitioner and as the supervising physician I was immediately available for consultation/collaboration.  Evan Corey, MD    Evan S Corey, MD 11/20/13 2113 

## 2013-11-22 LAB — CULTURE, GROUP A STREP

## 2015-05-20 ENCOUNTER — Ambulatory Visit (INDEPENDENT_AMBULATORY_CARE_PROVIDER_SITE_OTHER): Payer: BLUE CROSS/BLUE SHIELD | Admitting: Urgent Care

## 2015-05-20 VITALS — BP 122/70 | HR 75 | Temp 98.7°F | Resp 18 | Ht 74.0 in | Wt 341.0 lb

## 2015-05-20 DIAGNOSIS — Z113 Encounter for screening for infections with a predominantly sexual mode of transmission: Secondary | ICD-10-CM | POA: Diagnosis not present

## 2015-05-20 DIAGNOSIS — E669 Obesity, unspecified: Secondary | ICD-10-CM

## 2015-05-20 DIAGNOSIS — Z0000 Encounter for general adult medical examination without abnormal findings: Secondary | ICD-10-CM | POA: Diagnosis not present

## 2015-05-20 LAB — CBC
HCT: 41.4 % (ref 39.0–52.0)
HEMOGLOBIN: 13 g/dL (ref 13.0–17.0)
MCH: 21.9 pg — ABNORMAL LOW (ref 26.0–34.0)
MCHC: 31.4 g/dL (ref 30.0–36.0)
MCV: 69.8 fL — ABNORMAL LOW (ref 78.0–100.0)
MPV: 10.3 fL (ref 8.6–12.4)
Platelets: 351 10*3/uL (ref 150–400)
RBC: 5.93 MIL/uL — AB (ref 4.22–5.81)
RDW: 16.3 % — ABNORMAL HIGH (ref 11.5–15.5)
WBC: 9.6 10*3/uL (ref 4.0–10.5)

## 2015-05-20 LAB — COMPREHENSIVE METABOLIC PANEL
ALBUMIN: 4.3 g/dL (ref 3.6–5.1)
ALT: 24 U/L (ref 9–46)
AST: 31 U/L (ref 10–40)
Alkaline Phosphatase: 106 U/L (ref 40–115)
BILIRUBIN TOTAL: 0.4 mg/dL (ref 0.2–1.2)
BUN: 13 mg/dL (ref 7–25)
CO2: 28 mmol/L (ref 20–31)
CREATININE: 0.98 mg/dL (ref 0.60–1.35)
Calcium: 9.5 mg/dL (ref 8.6–10.3)
Chloride: 102 mmol/L (ref 98–110)
Glucose, Bld: 89 mg/dL (ref 65–99)
Potassium: 4.3 mmol/L (ref 3.5–5.3)
SODIUM: 140 mmol/L (ref 135–146)
TOTAL PROTEIN: 7.5 g/dL (ref 6.1–8.1)

## 2015-05-20 LAB — LIPID PANEL
CHOLESTEROL: 193 mg/dL (ref 125–200)
HDL: 42 mg/dL (ref >=40)
LDL CALC: 119 mg/dL (ref <130)
TRIGLYCERIDES: 159 mg/dL — AB (ref <150)
Total CHOL/HDL Ratio: 4.6 Ratio (ref <=5.0)
VLDL: 32 mg/dL — ABNORMAL HIGH (ref <30)

## 2015-05-20 LAB — TSH: TSH: 1.758 u[IU]/mL (ref 0.350–4.500)

## 2015-05-20 NOTE — Patient Instructions (Signed)

## 2015-05-20 NOTE — Progress Notes (Signed)
MRN: [id number]  Subjective:   Mr. Victor Atkins is a 29 y.o. male new to our practice presenting for annual physical exam.  Medical care team includes: PCP: No primary care provider on file. Specialists: None.   Marguis does not have any active problems on his problem list.  Patient is presenting for an annual physical. He is currently living on his own, has 1 girlfriend. He is sexually active with his girlfriend, does not use protection. Would like to have STI testing today. Patient exercises 4-5 times per week, admits mixed healthy-unhealthy diet. He has one alcoholic drink per week. He quit smoking about 2 months ago, used to smoke plaque and mild occasionally. He does report intermittent achy low back pain, resolved with 200 mg of ibuprofen one to 2 times per week.  Ah has a current medication list which includes the following prescription(s): ibuprofen. He has No Known Allergies.  Neely  has a past medical history of Ingrown toenail. Also  has no past surgical history on file.  His family history includes Cancer in his father; Diabetes in his other; Hypertension in his other. His father had sarcoma and is currently in remission.  Review of Systems  Constitutional: Negative for fever, chills, weight loss, malaise/fatigue and diaphoresis.  HENT: Negative for congestion, ear discharge, ear pain, hearing loss, nosebleeds, sore throat and tinnitus.   Eyes: Negative for blurred vision, double vision, photophobia, pain, discharge and redness.  Respiratory: Negative for cough, shortness of breath and wheezing.   Cardiovascular: Negative for chest pain, palpitations and leg swelling.  Gastrointestinal: Negative for nausea, vomiting, abdominal pain, diarrhea, constipation and blood in stool.  Genitourinary: Negative for dysuria, urgency, frequency, hematuria and flank pain.  Musculoskeletal: Negative for myalgias, back pain and joint pain.  Skin: Negative for itching and rash.    Neurological: Negative for dizziness, tingling, seizures, loss of consciousness, weakness and headaches.  Endo/Heme/Allergies: Negative for polydipsia.  Psychiatric/Behavioral: Negative for depression, suicidal ideas, hallucinations, memory loss and substance abuse. The patient is not nervous/anxious and does not have insomnia.      Objective:   Vitals: BP 122/70 mmHg  Pulse 75  Temp(Src) 98.7 F (37.1 C) (Oral)  Resp 18  Ht 6\' 2"  (1.88 m)  Wt 341 lb (154.677 kg)  BMI 43.76 kg/m2  SpO2 99%   Visual Acuity Screening   Right eye Left eye Both eyes  Without correction:     With correction: 20/15 20/15 20/15    Physical Exam  Constitutional: He is oriented to person, place, and time. He appears well-developed and well-nourished.  Body habitus is obese.  HENT:  TM's intact bilaterally, no effusions or erythema. Nares patent, nasal turbinates pink and moist, nasal passages patent. No sinus tenderness. Oropharynx clear, mucous membranes moist, dentition in good repair. Slightly enlarged tonsils.  Eyes: Conjunctivae and EOM are normal. Pupils are equal, round, and reactive to light. Right eye exhibits no discharge. Left eye exhibits no discharge. No scleral icterus.  Neck: Normal range of motion. Neck supple. No thyromegaly present.  Cardiovascular: Normal rate, regular rhythm and intact distal pulses.  Exam reveals no gallop and no friction rub.   No murmur heard. Pulmonary/Chest: No stridor. No respiratory distress. He has no wheezes. He has no rales.  Abdominal: Soft. Bowel sounds are normal. He exhibits no distension and no mass. There is no tenderness.  Musculoskeletal: Normal range of motion. He exhibits no edema or tenderness.  Lymphadenopathy:    He has no cervical adenopathy.  Neurological: He is alert and oriented to person, place, and time.  Skin: Skin is warm and dry. No rash noted. No erythema. No pallor.  Psychiatric: He has a normal mood and affect.   Assessment and  Plan :   1. Annual physical exam - Discussed healthy lifestyle, diet, exercise, preventative care, vaccinations, and addressed patient's concerns.  - CBC - Comprehensive metabolic panel - Lipid panel - TSH  2. Obesity - Advised dietary modification, continue exercise.  3. Screen for sexually transmitted diseases - Labs pending, I will have to call patient to come back for urine sample testing for gonorrhea, chlamydia. - HIV antibody - RPR  Wallis Bamberg, PA-C [facility] [phone] 05/20/2015  3:30 PM

## 2015-05-21 ENCOUNTER — Telehealth: Payer: Self-pay | Admitting: Urgent Care

## 2015-05-21 LAB — HIV ANTIBODY (ROUTINE TESTING W REFLEX): HIV: NONREACTIVE

## 2015-05-21 LAB — RPR

## 2015-05-21 NOTE — Telephone Encounter (Signed)
Triglycerides and VLDL slightly elevated, CBC slightly abnormal. Advised patient continue with dietary modifications for his lipid panel. Recheck CBC in 1 month or at the latest 6 months. Reminded patient that he is to return to clinic for her urine sample and gonorrhea Chlamydia testing.

## 2015-05-22 ENCOUNTER — Telehealth: Payer: Self-pay | Admitting: Family Medicine

## 2015-05-22 NOTE — Telephone Encounter (Signed)
lmom to call lab back about lab results

## 2015-08-18 ENCOUNTER — Emergency Department (HOSPITAL_COMMUNITY): Payer: Self-pay

## 2015-08-18 ENCOUNTER — Encounter (HOSPITAL_COMMUNITY): Payer: Self-pay

## 2015-08-18 ENCOUNTER — Emergency Department (HOSPITAL_COMMUNITY)
Admission: EM | Admit: 2015-08-18 | Discharge: 2015-08-18 | Disposition: A | Discharge status: Home / Self Care | Payer: Self-pay | Attending: Emergency Medicine | Admitting: Emergency Medicine

## 2015-08-18 DIAGNOSIS — M109 Gout, unspecified: Secondary | ICD-10-CM

## 2015-08-18 DIAGNOSIS — Z872 Personal history of diseases of the skin and subcutaneous tissue: Secondary | ICD-10-CM | POA: Insufficient documentation

## 2015-08-18 DIAGNOSIS — M1A0710 Idiopathic chronic gout, right ankle and foot, without tophus (tophi): Secondary | ICD-10-CM | POA: Insufficient documentation

## 2015-08-18 DIAGNOSIS — Z87891 Personal history of nicotine dependence: Secondary | ICD-10-CM | POA: Insufficient documentation

## 2015-08-18 MED ORDER — COLCHICINE 0.6 MG PO TABS: 0.6000 mg | ORAL_TABLET | Freq: Two times a day (BID) | ORAL | Status: DC

## 2015-08-18 NOTE — ED Notes (Signed)
Pt transported to radiology with tech

## 2015-08-18 NOTE — Discharge Instructions (Signed)
Gout °Gout is an inflammatory arthritis caused by a buildup of uric acid crystals in the joints. Uric acid is a chemical that is normally present in the blood. When the level of uric acid in the blood is too high it can form crystals that deposit in your joints and tissues. This causes joint redness, soreness, and swelling (inflammation). Repeat attacks are common. Over time, uric acid crystals can form into masses (tophi) near a joint, destroying bone and causing disfigurement. Gout is treatable and often preventable. °CAUSES  °The disease begins with elevated levels of uric acid in the blood. Uric acid is produced by your body when it breaks down a naturally found substance called purines. Certain foods you eat, such as meats and fish, contain high amounts of purines. Causes of an elevated uric acid level include: °· Being passed down from parent to child (heredity). °· Diseases that cause increased uric acid production (such as obesity, psoriasis, and certain cancers). °· Excessive alcohol use. °· Diet, especially diets rich in meat and seafood. °· Medicines, including certain cancer-fighting medicines (chemotherapy), water pills (diuretics), and aspirin. °· Chronic kidney disease. The kidneys are no longer able to remove uric acid well. °· Problems with metabolism. °Conditions strongly associated with gout include: °· Obesity. °· High blood pressure. °· High cholesterol. °· Diabetes. °Not everyone with elevated uric acid levels gets gout. It is not understood why some people get gout and others do not. Surgery, joint injury, and eating too much of certain foods are some of the factors that can lead to gout attacks. °SYMPTOMS  °· An attack of gout comes on quickly. It causes intense pain with redness, swelling, and warmth in a joint. °· Fever can occur. °· Often, only one joint is involved. Certain joints are more commonly involved: °· Base of the big toe. °· Knee. °· Ankle. °· Wrist. °· Finger. °Without  treatment, an attack usually goes away in a few days to weeks. Between attacks, you usually will not have symptoms, which is different from many other forms of arthritis. °DIAGNOSIS  °Your caregiver will suspect gout based on your symptoms and exam. In some cases, tests may be recommended. The tests may include: °· Blood tests. °· Urine tests. °· X-rays. °· Joint fluid exam. This exam requires a needle to remove fluid from the joint (arthrocentesis). Using a microscope, gout is confirmed when uric acid crystals are seen in the joint fluid. °TREATMENT  °There are two phases to gout treatment: treating the sudden onset (acute) attack and preventing attacks (prophylaxis). °· Treatment of an Acute Attack. °· Medicines are used. These include anti-inflammatory medicines or steroid medicines. °· An injection of steroid medicine into the affected joint is sometimes necessary. °· The painful joint is rested. Movement can worsen the arthritis. °· You may use warm or cold treatments on painful joints, depending which works best for you. °· Treatment to Prevent Attacks. °· If you suffer from frequent gout attacks, your caregiver may advise preventive medicine. These medicines are started after the acute attack subsides. These medicines either help your kidneys eliminate uric acid from your body or decrease your uric acid production. You may need to stay on these medicines for a very long time. °· The early phase of treatment with preventive medicine can be associated with an increase in acute gout attacks. For this reason, during the first few months of treatment, your caregiver may also advise you to take medicines usually used for acute gout treatment. Be sure you   understand your caregiver's directions. Your caregiver may make several adjustments to your medicine dose before these medicines are effective.  Discuss dietary treatment with your caregiver or dietitian. Alcohol and drinks high in sugar and fructose and foods  such as meat, poultry, and seafood can increase uric acid levels. Your caregiver or dietitian can advise you on drinks and foods that should be limited. HOME CARE INSTRUCTIONS   Do not take aspirin to relieve pain. This raises uric acid levels.  Only take over-the-counter or prescription medicines for pain, discomfort, or fever as directed by your caregiver.  Rest the joint as much as possible. When in bed, keep sheets and blankets off painful areas.  Keep the affected joint raised (elevated).  Apply warm or cold treatments to painful joints. Use of warm or cold treatments depends on which works best for you.  Use crutches if the painful joint is in your leg.  Drink enough fluids to keep your urine clear or pale yellow. This helps your body get rid of uric acid. Limit alcohol, sugary drinks, and fructose drinks.  Follow your dietary instructions. Pay careful attention to the amount of protein you eat. Your daily diet should emphasize fruits, vegetables, whole grains, and fat-free or low-fat milk products. Discuss the use of coffee, vitamin C, and cherries with your caregiver or dietitian. These may be helpful in lowering uric acid levels.  Maintain a healthy body weight. SEEK MEDICAL CARE IF:   You develop diarrhea, vomiting, or any side effects from medicines.  You do not feel better in 24 hours, or you are getting worse. SEEK IMMEDIATE MEDICAL CARE IF:   Your joint becomes suddenly more tender, and you have chills or a fever. MAKE SURE YOU:   Understand these instructions.  Will watch your condition.  Will get help right away if you are not doing well or get worse.   This information is not intended to replace advice given to you by your health care provider. Make sure you discuss any questions you have with your health care provider.   Document Released: 08/26/2000 Document Revised: 09/19/2014 Document Reviewed: 04/11/2012 Elsevier Interactive Patient Education 2016  [place] are compounds that affect the level of uric acid in your body. A low-purine diet is a diet that is low in purines. Eating a low-purine diet can prevent the level of uric acid in your body from getting too high and causing gout or kidney stones or both. WHAT DO I NEED TO KNOW ABOUT THIS DIET?  Choose low-purine foods. Examples of low-purine foods are listed in the next section.  Drink plenty of fluids, especially water. Fluids can help remove uric acid from your body. Try to drink 8-16 cups (1.9-3.8 L) a day.  Limit foods high in fat, especially saturated fat, as fat makes it harder for the body to get rid of uric acid. Foods high in saturated fat include pizza, cheese, ice cream, whole milk, fried foods, and gravies. Choose foods that are lower in fat and lean sources of protein. Use olive oil when cooking as it contains healthy fats that are not high in saturated fat.  Limit alcohol. Alcohol interferes with the elimination of uric acid from your body. If you are having a gout attack, avoid all alcohol.  Keep in mind that different people's bodies react differently to different foods. You will probably learn over time which foods do or do not affect you. If you discover that a food tends to  cause your gout to flare up, avoid eating that food. You can more freely enjoy foods that do not cause problems. If you have any questions about a food item, talk to your dietitian or health care provider. WHICH FOODS ARE LOW, MODERATE, AND HIGH IN PURINES? The following is a list of foods that are low, moderate, and high in purines. You can eat any amount of the foods that are low in purines. You may be able to have small amounts of foods that are moderate in purines. Ask your health care provider how much of a food moderate in purines you can have. Avoid foods high in purines. Grains  Foods low in purines: Enriched white bread, pasta, rice, cake, cornbread,  popcorn.  Foods moderate in purines: Whole-grain breads and cereals, wheat germ, bran, oatmeal. Uncooked oatmeal. Dry wheat bran or wheat germ.  Foods high in purines: Pancakes, [place] toast, biscuits, muffins. Vegetables  Foods low in purines: All vegetables, except those that are moderate in purines.  Foods moderate in purines: Asparagus, cauliflower, spinach, mushrooms, green peas. Fruits  All fruits are low in purines. Meats and other Protein Foods  Foods low in purines: Eggs, nuts, peanut butter.  Foods moderate in purines: 80-90% lean beef, lamb, veal, pork, poultry, fish, eggs, peanut butter, nuts. Crab, lobster, oysters, and shrimp. Cooked dried beans, peas, and lentils.  Foods high in purines: Anchovies, sardines, herring, mussels, tuna, codfish, scallops, trout, and haddock. Tomasa BlaseBacon. Organ meats (such as liver or kidney). Tripe. Game meat. Goose. Sweetbreads. Dairy  All dairy foods are low in purines. Low-fat and fat-free dairy products are best because they are low in saturated fat. Beverages  Drinks low in purines: Water, carbonated beverages, tea, coffee, cocoa.  Drinks moderate in purines: Soft drinks and other drinks sweetened with high-fructose corn syrup. Juices. To find whether a food or drink is sweetened with high-fructose corn syrup, look at the ingredients list.  Drinks high in purines: Alcoholic beverages (such as beer). Condiments  Foods low in purines: Salt, herbs, olives, pickles, relishes, vinegar.  Foods moderate in purines: Butter, margarine, oils, mayonnaise. Fats and Oils  Foods low in purines: All types, except gravies and sauces made with meat.  Foods high in purines: Gravies and sauces made with meat. Other Foods  Foods low in purines: Sugars, sweets, gelatin. Cake. Soups made without meat.  Foods moderate in purines: Meat-based or fish-based soups, broths, or bouillons. Foods and drinks sweetened with high-fructose corn syrup.  Foods high  in purines: High-fat desserts (such as ice cream, cookies, cakes, pies, doughnuts, and chocolate). Contact your dietitian for more information on foods that are not listed here.   This information is not intended to replace advice given to you by your health care provider. Make sure you discuss any questions you have with your health care provider.   Follow up with you rPCP as needed or if symptoms worsen. See above for diet recommendations to avoid gouty flares in the future. Return to the emergency department if you expands worsening of her symptoms, if your joint becomes severely inflamed, red or warm to the touch, or if you develop a fever.

## 2015-08-18 NOTE — ED Notes (Signed)
Pt has hx of gout in toes.  Starting having pain in rt big toe on Saturday.  Ibuprofen not helping

## 2015-08-18 NOTE — ED Provider Notes (Signed)
CSN: [id number]     Arrival date & time 08/18/15  1014 History   First MD Initiated Contact with Patient 08/18/15 1103     Chief Complaint  Patient presents with  . Gout     (Consider location/radiation/quality/duration/timing/severity/associated sxs/prior Treatment) HPI   Victor Atkins is a 29 y.o M with a pmhx of gout who presents to the Ed today c/o pain in his R great toe. Pt states that he was walking on Saturday when he felt gradual onset pain in his R great toe at the base. Pain has progressively worsened and increases with walking or movement of the toe. Pt states that he had a gouty flare 2 years ago and this feels similar to that. Pt father has long history of gout. Denies fever, trauma or injury, edema, warmth of the toe.   Past Medical History  Diagnosis Date  . Ingrown toenail    History reviewed. No pertinent past surgical history. Family History  Problem Relation Age of Onset  . Cancer Father     Bone cancer  . Diabetes Other   . Hypertension Other    Social History  Substance Use Topics  . Smoking status: Former Smoker    Types: Cigars    Quit date: 07/23/2013  . Smokeless tobacco: None  . Alcohol Use: Yes     Comment: occasional    Review of Systems  All other systems reviewed and are negative.     Allergies  Review of patient's allergies indicates no known allergies.  Home Medications   Prior to Admission medications   Medication Sig Start Date End Date Taking? Authorizing Provider  ibuprofen (ADVIL,MOTRIN) 200 MG tablet Take 800 mg by mouth every 6 (six) hours as needed for moderate pain.     Historical Provider, MD   BP 157/96 mmHg  Pulse 66  Temp(Src) 98.8 F (37.1 C) (Oral)  Resp 18  SpO2 100% Physical Exam  Constitutional: He is oriented to person, place, and time. He appears well-developed and well-nourished. No distress.  HENT:  Head: Normocephalic and atraumatic.  Eyes: Conjunctivae are normal. Right eye exhibits no discharge. Left  eye exhibits no discharge. No scleral icterus.  Cardiovascular: Normal rate.   Pulmonary/Chest: Effort normal.  Musculoskeletal:       Right ankle: Normal.       Feet:  Mild swelling and tenderness at the base of the right great toe. No erythema. No warmth. Decreased range of motion limited by pain.  Neurological: He is alert and oriented to person, place, and time. Coordination normal.  Skin: Skin is warm and dry. No rash noted. He is not diaphoretic. No erythema. No pallor.  Psychiatric: He has a normal mood and affect. His behavior is normal.  Nursing note and vitals reviewed.   ED Course  Procedures (including critical care time) Labs Review Labs Reviewed - No data to display  Imaging Review Dg Toe Great Right  08/18/2015  CLINICAL DATA:  Pain for 5 days. EXAM: RIGHT FIRST TOE COMPARISON:  Right foot December 24, 2012 FINDINGS: Frontal, oblique, and lateral views were obtained. There is no demonstrable fracture or dislocation. Joint spaces appear intact. No erosive change or periostitis. IMPRESSION: No fracture or dislocation.  No appreciable arthropathy. Electronically Signed   By: Bretta Bang III M.D.   On: 08/18/2015 11:59   I have personally reviewed and evaluated these images and lab results as part of my medical decision-making.   EKG Interpretation None  MDM   Final diagnoses:  Acute gout of right foot, unspecified cause    Pt presents with monoarticular pain and swelling. Patient with history of gouty flare.  Pt is afebrile and stable. Imaging reviewed, no evidence of occult fracture or injury. Pt without known peptic ulcer disease and not receiving concurrent treatment on warfarin. Pt dc with colchicine, which he has taken in the past for gouty flares and had improvement with. Patient states he does not want to take indomethacin as he fears it may negatively affect his kidneys. Patient may take 800 mg ibuprofen as needed for pain. Discussed that pt should  respond to treatment with in 24 hour of begining treatment & likely resolve in 2-3 days. Encourage patient to establish primary care to be any be followed by this in the future. Discussed dietary recommendations to prevent gouty flares in the future. Patient is stable and ready for discharge.     Lester KinsmanSamantha Tripp Rest HavenDowless, PA-C 08/18/15 1236  Benjiman CoreNathan Pickering, MD 08/18/15 [phone]

## 2015-08-18 NOTE — ED Notes (Signed)
Pt returned from radiology.

## 2015-09-30 IMAGING — CR DG CHEST 2V
2 series · 2 of 2 positions shown · non-contrast
Comparison: April 16, 2010

CLINICAL DATA: Chest pain

EXAM:
CHEST  2 VIEW

[w chest pa]
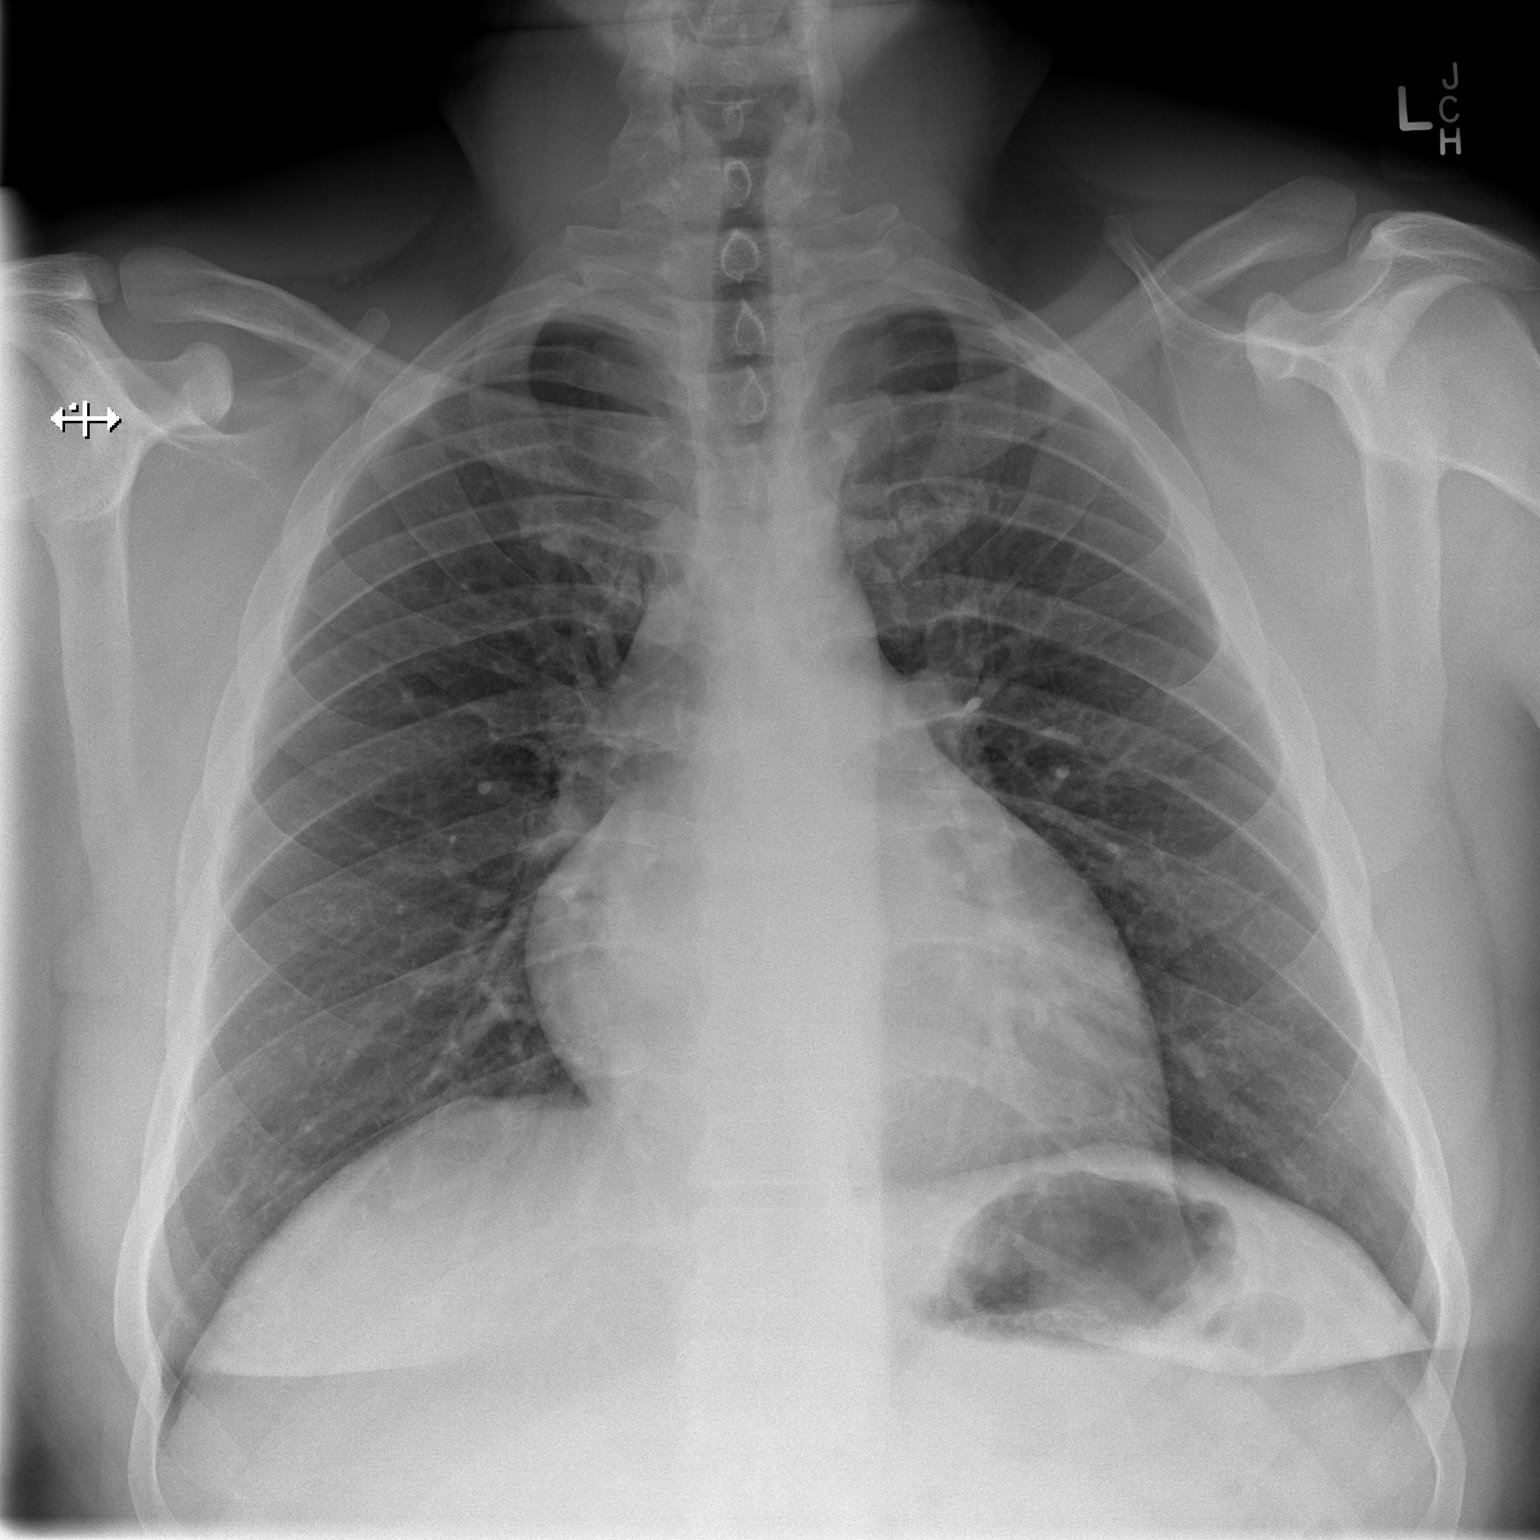

[w chest lat]
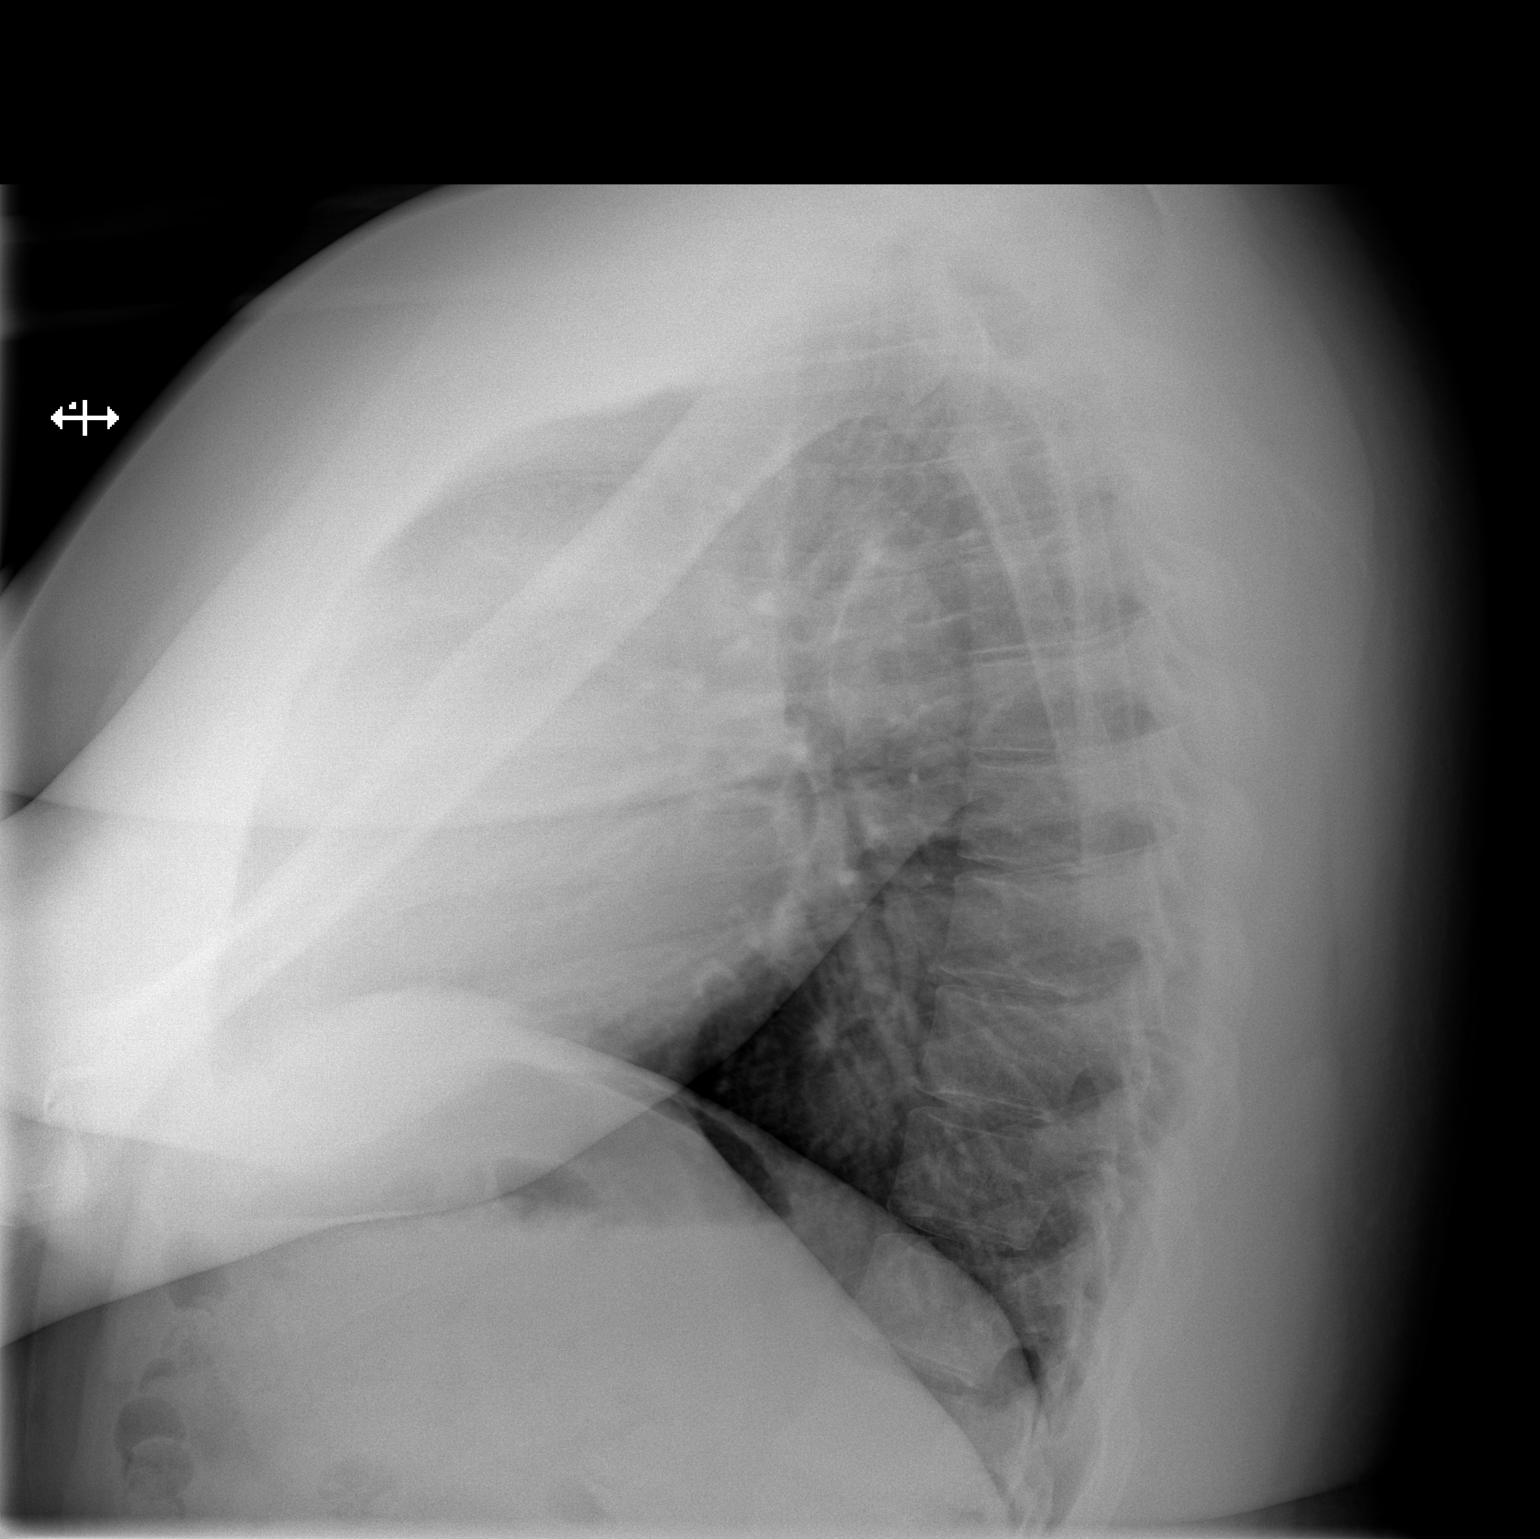

[2 of 2 positions shown; findings below may reference images not displayed]

FINDINGS: Lungs are clear. Heart size and pulmonary vascularity are normal. No
adenopathy. No pneumothorax. No bone lesions.
IMPRESSION: No abnormality noted.

## 2017-02-02 ENCOUNTER — Emergency Department (HOSPITAL_COMMUNITY)
Admission: EM | Admit: 2017-02-02 | Discharge: 2017-02-02 | Disposition: A | Discharge status: Home / Self Care | Payer: Self-pay | Attending: Emergency Medicine | Admitting: Emergency Medicine

## 2017-02-02 ENCOUNTER — Encounter (HOSPITAL_COMMUNITY): Payer: Self-pay | Admitting: Emergency Medicine

## 2017-02-02 DIAGNOSIS — M10071 Idiopathic gout, right ankle and foot: Secondary | ICD-10-CM | POA: Insufficient documentation

## 2017-02-02 DIAGNOSIS — F17200 Nicotine dependence, unspecified, uncomplicated: Secondary | ICD-10-CM | POA: Insufficient documentation

## 2017-02-02 DIAGNOSIS — Z79899 Other long term (current) drug therapy: Secondary | ICD-10-CM | POA: Insufficient documentation

## 2017-02-02 HISTORY — DX: Gout, unspecified: M10.9

## 2017-02-02 LAB — CBG MONITORING, ED: Glucose-Capillary: 131 mg/dL — ABNORMAL HIGH (ref 65–99)

## 2017-02-02 MED ORDER — IBUPROFEN 800 MG PO TABS
800.0000 mg | ORAL_TABLET | Freq: Once | ORAL | Status: AC
  Administered 2017-02-02: 800 mg via ORAL
  Filled 2017-02-02: qty 1

## 2017-02-02 MED ORDER — INDOMETHACIN 25 MG PO CAPS: 25.0000 mg | ORAL_CAPSULE | Freq: Three times a day (TID) | ORAL | 0 refills | Status: AC | PRN

## 2017-02-02 MED ORDER — PREDNISONE 20 MG PO TABS: ORAL_TABLET | ORAL | 0 refills | Status: AC

## 2017-02-02 MED ORDER — COLCHICINE 0.6 MG PO TABS: 0.6000 mg | ORAL_TABLET | Freq: Two times a day (BID) | ORAL | 0 refills | Status: AC

## 2017-02-02 NOTE — ED Triage Notes (Signed)
Pt comes with complaints of possible gout in his right big toe.  States he came here before with the exact same symptoms and was diagnosed with it.  Able to ambulate freely.  No other complaints at this time.

## 2017-02-02 NOTE — ED Provider Notes (Signed)
[facility]-EMERGENCY DEPT Provider Note   CSN: [id number] Arrival date & time: 02/02/17  1911   By signing my name below, I, Clarisse Gouge, attest that this documentation has been prepared under the direction and in the presence of Fayrene Helper, PA-C. Electronically Signed: Clarisse Gouge, Scribe. 02/02/17. 9:23 PM.   History   Chief Complaint Chief Complaint  Patient presents with  . Gout   The history is provided by the patient and medical records. No language interpreter was used.    Victor Atkins is a 31 y.o. male with h/o gout who presents to the Emergency Department with concern for gradually worsening R big toe pain. He notes gout flare ups 1 time/ year, and he states he recently drank more soda than usual. Sxs started yesterday.  He describes 10/10 throbbing, stabbing pain worse with contact, application of pressure to the affected area and walking. He notes prior relief with prescribed cochicine. No interventions reported PTA. No other modifying factors noted. No recent trauma noted. No fever or numbmness. No knee or ankle pain. No h/o DM noted. No other complaints at this time. No PCP for F/U.  Pt requesting CBG study to R/O DM.  Past Medical History:  Diagnosis Date  . Gout   . Ingrown toenail     Patient Active Problem List   Diagnosis Date Noted  . Obesity 05/20/2015    History reviewed. No pertinent surgical history.     Home Medications    Prior to Admission medications   Medication Sig Start Date End Date Taking? Authorizing Provider  colchicine 0.6 MG tablet Take 1 tablet (0.6 mg total) by mouth 2 (two) times daily. 08/18/15   Dowless, Lelon Mast Tripp, PA-C  ibuprofen (ADVIL,MOTRIN) 200 MG tablet Take 800 mg by mouth every 6 (six) hours as needed for moderate pain.     [provider]    Family History Family History  Problem Relation Age of Onset  . Cancer Father        Bone cancer  . Diabetes Other   . Hypertension Other     Social  History Social History  Substance Use Topics  . Smoking status: Former Smoker    Types: Cigars    Quit date: 07/23/2013  . Smokeless tobacco: Never Used  . Alcohol use Yes     Comment: occasional     Allergies   Patient has no known allergies.   Review of Systems Review of Systems  Musculoskeletal: Positive for arthralgias and gait problem. Negative for joint swelling.  Skin: Negative for wound.  Neurological: Negative for weakness and numbness.  All other systems reviewed and are negative.    Physical Exam Updated Vital Signs BP (!) 158/89 (BP Location: Left Arm)   Pulse 60   Temp 98.1 F (36.7 C)   Resp 20   Ht 6\' 3"  (1.905 m)   Wt (!) 336 lb (152.4 kg)   SpO2 100%   BMI 42.00 kg/m   Physical Exam  Constitutional: He is oriented to person, place, and time. He appears well-developed and well-nourished.  HENT:  Head: Normocephalic.  Eyes: EOM are normal.  Neck: Normal range of motion.  Pulmonary/Chest: Effort normal.  Abdominal: He exhibits no distension.  Musculoskeletal: Normal range of motion.  R foot: tenderness to great toe extending to 1st MTP joint and medial aspect of foot with mild erythema and warmth, but no abscess and no sign of septic joint. R ankle with FROM. D/P pulse palpable.  Neurological: He is  alert and oriented to person, place, and time.  Psychiatric: He has a normal mood and affect.  Nursing note and vitals reviewed.    ED Treatments / Results  DIAGNOSTIC STUDIES: Oxygen Saturation is 100% on RA, NL by my interpretation.    COORDINATION OF CARE: 8:52 PM-Discussed next steps with pt. Pt verbalized understanding and is agreeable with the plan. Will order/Rx medications. Pt prepared for d/c, advised of symptomatic care at home and return precautions.    Labs (all labs ordered are listed, but only abnormal results are displayed) Labs Reviewed - No data to display  EKG  EKG Interpretation None       Radiology No results  found.  Procedures Procedures (including critical care time)  Medications Ordered in ED Medications - No data to display   Initial Impression / Assessment and Plan / ED Course  I have reviewed the triage vital signs and the nursing notes.  Pertinent labs & imaging results that were available during my care of the patient were reviewed by me and considered in my medical decision making (see chart for details).      Pt presents with monoarticular pain, swelling and erythema.  Pt is afebrile and stable. Imaging reviewed, no evidence of occult fracture or injury. Renal function good. Pt without known peptic ulcer disease and not receiving concurrent treatment on warfarin. Pt dc with indomethacin (50 mg PO TID). Discussed that pt should respond to treatment with in 24 hour of begining treatment & likely resolve in 2-3 days.    BP (!) 158/89 (BP Location: Left Arm)   Pulse 60   Temp 98.1 F (36.7 C)   Resp 20   Ht 6\' 3"  (1.905 m)   Wt (!) 152.4 kg (336 lb)   SpO2 100%   BMI 42.00 kg/m    Final Clinical Impressions(s) / ED Diagnoses   Final diagnoses:  Acute idiopathic gout of right foot    New Prescriptions New Prescriptions   INDOMETHACIN (INDOCIN) 25 MG CAPSULE    Take 1 capsule (25 mg total) by mouth 3 (three) times daily as needed.   PREDNISONE (DELTASONE) 20 MG TABLET    3 tabs po day one, then 2 tabs daily x 4 days   I personally performed the services described in this documentation, which was scribed in my presence. The recorded information has been reviewed and is accurate.      Fayrene Helperran, Lenox Bink, PA-C 02/02/17 2130    Fayrene Helperran, Perseus Westall, PA-C 02/02/17 2142    Little, Ambrose Finlandachel Morgan, MD 02/07/17 [phone]

## 2018-01-28 DIAGNOSIS — M109 Gout, unspecified: Secondary | ICD-10-CM | POA: Diagnosis not present

## 2018-01-28 DIAGNOSIS — Z23 Encounter for immunization: Secondary | ICD-10-CM | POA: Diagnosis not present

## 2018-02-06 DIAGNOSIS — M109 Gout, unspecified: Secondary | ICD-10-CM | POA: Diagnosis not present

## 2018-02-23 DIAGNOSIS — M79671 Pain in right foot: Secondary | ICD-10-CM | POA: Diagnosis not present

## 2018-04-23 DIAGNOSIS — Z8249 Family history of ischemic heart disease and other diseases of the circulatory system: Secondary | ICD-10-CM | POA: Diagnosis not present

## 2018-04-23 DIAGNOSIS — Z833 Family history of diabetes mellitus: Secondary | ICD-10-CM | POA: Diagnosis not present

## 2018-04-23 DIAGNOSIS — Z13220 Encounter for screening for lipoid disorders: Secondary | ICD-10-CM | POA: Diagnosis not present

## 2018-04-23 DIAGNOSIS — M109 Gout, unspecified: Secondary | ICD-10-CM | POA: Diagnosis not present

## 2018-04-23 DIAGNOSIS — Z131 Encounter for screening for diabetes mellitus: Secondary | ICD-10-CM | POA: Diagnosis not present

## 2018-06-20 DIAGNOSIS — Z23 Encounter for immunization: Secondary | ICD-10-CM | POA: Diagnosis not present

## 2018-08-31 DIAGNOSIS — R7309 Other abnormal glucose: Secondary | ICD-10-CM | POA: Diagnosis not present

## 2018-08-31 DIAGNOSIS — R7303 Prediabetes: Secondary | ICD-10-CM | POA: Diagnosis not present

## 2018-08-31 DIAGNOSIS — E7800 Pure hypercholesterolemia, unspecified: Secondary | ICD-10-CM | POA: Diagnosis not present

## 2018-08-31 DIAGNOSIS — M109 Gout, unspecified: Secondary | ICD-10-CM | POA: Diagnosis not present

## 2018-09-13 DIAGNOSIS — Z0000 Encounter for general adult medical examination without abnormal findings: Secondary | ICD-10-CM | POA: Diagnosis not present

## 2018-09-13 DIAGNOSIS — R7303 Prediabetes: Secondary | ICD-10-CM | POA: Diagnosis not present

## 2018-09-13 DIAGNOSIS — M109 Gout, unspecified: Secondary | ICD-10-CM | POA: Diagnosis not present

## 2018-10-14 DIAGNOSIS — R6889 Other general symptoms and signs: Secondary | ICD-10-CM | POA: Diagnosis not present

## 2018-11-02 DIAGNOSIS — M25561 Pain in right knee: Secondary | ICD-10-CM | POA: Diagnosis not present

## 2018-11-16 DIAGNOSIS — S83241A Other tear of medial meniscus, current injury, right knee, initial encounter: Secondary | ICD-10-CM | POA: Diagnosis not present

## 2018-11-20 ENCOUNTER — Other Ambulatory Visit: Payer: Self-pay | Admitting: Sports Medicine

## 2018-11-20 DIAGNOSIS — S83249A Other tear of medial meniscus, current injury, unspecified knee, initial encounter: Secondary | ICD-10-CM

## 2018-11-22 DIAGNOSIS — M67863 Other specified disorders of tendon, right knee: Secondary | ICD-10-CM | POA: Diagnosis not present

## 2018-11-22 DIAGNOSIS — M25461 Effusion, right knee: Secondary | ICD-10-CM | POA: Diagnosis not present

## 2018-11-22 DIAGNOSIS — S83241A Other tear of medial meniscus, current injury, right knee, initial encounter: Secondary | ICD-10-CM | POA: Diagnosis not present

## 2018-11-22 DIAGNOSIS — M94261 Chondromalacia, right knee: Secondary | ICD-10-CM | POA: Diagnosis not present

## 2018-11-24 ENCOUNTER — Other Ambulatory Visit: Payer: Commercial Managed Care - PPO

## 2018-11-26 ENCOUNTER — Other Ambulatory Visit: Payer: Commercial Managed Care - PPO

## 2018-11-26 DIAGNOSIS — M25561 Pain in right knee: Secondary | ICD-10-CM | POA: Diagnosis not present

## 2019-03-02 DIAGNOSIS — J029 Acute pharyngitis, unspecified: Secondary | ICD-10-CM | POA: Diagnosis not present

## 2019-05-14 DIAGNOSIS — M545 Low back pain: Secondary | ICD-10-CM | POA: Diagnosis not present

## 2019-08-19 DIAGNOSIS — Z20828 Contact with and (suspected) exposure to other viral communicable diseases: Secondary | ICD-10-CM | POA: Diagnosis not present

## 2019-09-24 DIAGNOSIS — Z20828 Contact with and (suspected) exposure to other viral communicable diseases: Secondary | ICD-10-CM | POA: Diagnosis not present

## 2019-10-04 DIAGNOSIS — Z23 Encounter for immunization: Secondary | ICD-10-CM | POA: Diagnosis not present

## 2019-10-04 DIAGNOSIS — E7800 Pure hypercholesterolemia, unspecified: Secondary | ICD-10-CM | POA: Diagnosis not present

## 2019-10-04 DIAGNOSIS — R7303 Prediabetes: Secondary | ICD-10-CM | POA: Diagnosis not present

## 2019-10-04 DIAGNOSIS — Z0000 Encounter for general adult medical examination without abnormal findings: Secondary | ICD-10-CM | POA: Diagnosis not present

## 2019-10-08 DIAGNOSIS — Z20828 Contact with and (suspected) exposure to other viral communicable diseases: Secondary | ICD-10-CM | POA: Diagnosis not present

## 2019-10-16 DIAGNOSIS — Z20828 Contact with and (suspected) exposure to other viral communicable diseases: Secondary | ICD-10-CM | POA: Diagnosis not present

## 2019-10-18 DIAGNOSIS — D649 Anemia, unspecified: Secondary | ICD-10-CM | POA: Diagnosis not present

## 2020-06-27 DIAGNOSIS — Z20822 Contact with and (suspected) exposure to covid-19: Secondary | ICD-10-CM | POA: Diagnosis not present

## 2022-04-17 DIAGNOSIS — S8391XA Sprain of unspecified site of right knee, initial encounter: Secondary | ICD-10-CM | POA: Diagnosis not present

## 2022-04-17 DIAGNOSIS — S8392XA Sprain of unspecified site of left knee, initial encounter: Secondary | ICD-10-CM | POA: Diagnosis not present

## 2022-04-25 DIAGNOSIS — M222X1 Patellofemoral disorders, right knee: Secondary | ICD-10-CM | POA: Diagnosis not present

## 2022-04-25 DIAGNOSIS — M222X2 Patellofemoral disorders, left knee: Secondary | ICD-10-CM | POA: Diagnosis not present

## 2022-07-04 DIAGNOSIS — B001 Herpesviral vesicular dermatitis: Secondary | ICD-10-CM | POA: Diagnosis not present

## 2022-07-20 DIAGNOSIS — R051 Acute cough: Secondary | ICD-10-CM | POA: Diagnosis not present

## 2022-07-20 DIAGNOSIS — R059 Cough, unspecified: Secondary | ICD-10-CM | POA: Diagnosis not present

## 2022-07-20 DIAGNOSIS — R509 Fever, unspecified: Secondary | ICD-10-CM | POA: Diagnosis not present

## 2022-07-20 DIAGNOSIS — R0981 Nasal congestion: Secondary | ICD-10-CM | POA: Diagnosis not present

## 2022-07-20 DIAGNOSIS — Z03818 Encounter for observation for suspected exposure to other biological agents ruled out: Secondary | ICD-10-CM | POA: Diagnosis not present

## 2022-07-20 DIAGNOSIS — H1089 Other conjunctivitis: Secondary | ICD-10-CM | POA: Diagnosis not present

## 2022-07-20 DIAGNOSIS — H66002 Acute suppurative otitis media without spontaneous rupture of ear drum, left ear: Secondary | ICD-10-CM | POA: Diagnosis not present

## 2022-07-20 DIAGNOSIS — J029 Acute pharyngitis, unspecified: Secondary | ICD-10-CM | POA: Diagnosis not present

## 2022-11-01 DIAGNOSIS — R1111 Vomiting without nausea: Secondary | ICD-10-CM | POA: Diagnosis not present

## 2022-11-02 DIAGNOSIS — K219 Gastro-esophageal reflux disease without esophagitis: Secondary | ICD-10-CM | POA: Diagnosis not present

## 2022-11-07 DIAGNOSIS — K219 Gastro-esophageal reflux disease without esophagitis: Secondary | ICD-10-CM | POA: Diagnosis not present

## 2022-11-07 DIAGNOSIS — Q105 Congenital stenosis and stricture of lacrimal duct: Secondary | ICD-10-CM | POA: Diagnosis not present

## 2022-11-07 DIAGNOSIS — K5900 Constipation, unspecified: Secondary | ICD-10-CM | POA: Diagnosis not present

## 2022-11-14 DIAGNOSIS — R6811 Excessive crying of infant (baby): Secondary | ICD-10-CM | POA: Diagnosis not present

## 2022-11-14 DIAGNOSIS — K429 Umbilical hernia without obstruction or gangrene: Secondary | ICD-10-CM | POA: Diagnosis not present

## 2022-11-14 DIAGNOSIS — L704 Infantile acne: Secondary | ICD-10-CM | POA: Diagnosis not present

## 2022-11-14 DIAGNOSIS — K5900 Constipation, unspecified: Secondary | ICD-10-CM | POA: Diagnosis not present

## 2022-11-30 DIAGNOSIS — K625 Hemorrhage of anus and rectum: Secondary | ICD-10-CM | POA: Diagnosis not present

## 2023-04-25 DIAGNOSIS — M109 Gout, unspecified: Secondary | ICD-10-CM | POA: Diagnosis not present

## 2023-04-25 DIAGNOSIS — F4320 Adjustment disorder, unspecified: Secondary | ICD-10-CM | POA: Diagnosis not present

## 2023-04-25 DIAGNOSIS — E7800 Pure hypercholesterolemia, unspecified: Secondary | ICD-10-CM | POA: Diagnosis not present

## 2023-04-25 DIAGNOSIS — M25569 Pain in unspecified knee: Secondary | ICD-10-CM | POA: Diagnosis not present

## 2023-04-25 DIAGNOSIS — Z0000 Encounter for general adult medical examination without abnormal findings: Secondary | ICD-10-CM | POA: Diagnosis not present

## 2023-04-25 DIAGNOSIS — R7303 Prediabetes: Secondary | ICD-10-CM | POA: Diagnosis not present

## 2023-04-28 DIAGNOSIS — Z23 Encounter for immunization: Secondary | ICD-10-CM | POA: Diagnosis not present

## 2023-04-28 DIAGNOSIS — Z00129 Encounter for routine child health examination without abnormal findings: Secondary | ICD-10-CM | POA: Diagnosis not present

## 2023-05-31 DIAGNOSIS — E7800 Pure hypercholesterolemia, unspecified: Secondary | ICD-10-CM | POA: Diagnosis not present

## 2023-05-31 DIAGNOSIS — Z3009 Encounter for other general counseling and advice on contraception: Secondary | ICD-10-CM | POA: Diagnosis not present

## 2023-05-31 DIAGNOSIS — R030 Elevated blood-pressure reading, without diagnosis of hypertension: Secondary | ICD-10-CM | POA: Diagnosis not present

## 2023-05-31 DIAGNOSIS — R7303 Prediabetes: Secondary | ICD-10-CM | POA: Diagnosis not present

## 2023-06-25 DIAGNOSIS — R591 Generalized enlarged lymph nodes: Secondary | ICD-10-CM | POA: Diagnosis not present

## 2023-07-27 DIAGNOSIS — Z302 Encounter for sterilization: Secondary | ICD-10-CM | POA: Diagnosis not present

## 2023-08-31 DIAGNOSIS — N644 Mastodynia: Secondary | ICD-10-CM | POA: Diagnosis not present

## 2024-03-05 DIAGNOSIS — H5213 Myopia, bilateral: Secondary | ICD-10-CM | POA: Diagnosis not present

## 2024-03-05 DIAGNOSIS — H52223 Regular astigmatism, bilateral: Secondary | ICD-10-CM | POA: Diagnosis not present

## 2024-08-21 DIAGNOSIS — R0989 Other specified symptoms and signs involving the circulatory and respiratory systems: Secondary | ICD-10-CM | POA: Diagnosis not present

## 2024-08-21 DIAGNOSIS — R509 Fever, unspecified: Secondary | ICD-10-CM | POA: Diagnosis not present

## 2024-08-21 DIAGNOSIS — R1110 Vomiting, unspecified: Secondary | ICD-10-CM | POA: Diagnosis not present

## 2024-08-21 DIAGNOSIS — R0981 Nasal congestion: Secondary | ICD-10-CM | POA: Diagnosis not present

## 2024-08-21 DIAGNOSIS — R059 Cough, unspecified: Secondary | ICD-10-CM | POA: Diagnosis not present
